# Patient Record
Sex: Male | Born: 1965 | Race: White | Hispanic: No | Marital: Married | Smoking: Former smoker
Health system: Southern US, Community
[De-identification: ages and names within clinical notes are randomized; demographics above are authoritative.]

## PROBLEM LIST (undated history)

## (undated) DIAGNOSIS — E349 Endocrine disorder, unspecified: Secondary | ICD-10-CM

## (undated) DIAGNOSIS — K649 Unspecified hemorrhoids: Secondary | ICD-10-CM

## (undated) DIAGNOSIS — I4510 Unspecified right bundle-branch block: Secondary | ICD-10-CM

## (undated) DIAGNOSIS — Z87442 Personal history of urinary calculi: Secondary | ICD-10-CM

## (undated) DIAGNOSIS — C44519 Basal cell carcinoma of skin of other part of trunk: Secondary | ICD-10-CM

## (undated) DIAGNOSIS — I10 Essential (primary) hypertension: Secondary | ICD-10-CM

## (undated) DIAGNOSIS — M1990 Unspecified osteoarthritis, unspecified site: Secondary | ICD-10-CM

## (undated) DIAGNOSIS — K219 Gastro-esophageal reflux disease without esophagitis: Secondary | ICD-10-CM

## (undated) DIAGNOSIS — D649 Anemia, unspecified: Secondary | ICD-10-CM

## (undated) DIAGNOSIS — D751 Secondary polycythemia: Principal | ICD-10-CM

## (undated) HISTORY — DX: Secondary polycythemia: D75.1

## (undated) HISTORY — PX: TONSILLECTOMY: SUR1361

## (undated) HISTORY — PX: EYE SURGERY: SHX253

## (undated) HISTORY — PX: VASECTOMY: SHX75

## (undated) HISTORY — PX: SKIN CANCER EXCISION: SHX779

## (undated) HISTORY — PX: HAND SURGERY: SHX662

---

## 1999-09-13 ENCOUNTER — Ambulatory Visit (HOSPITAL_BASED_OUTPATIENT_CLINIC_OR_DEPARTMENT_OTHER): Admission: RE | Admit: 1999-09-13 | Discharge: 1999-09-13 | Payer: Self-pay | Admitting: Orthopedic Surgery

## 2006-05-02 ENCOUNTER — Encounter: Payer: Self-pay | Admitting: Vascular Surgery

## 2006-05-02 ENCOUNTER — Ambulatory Visit (HOSPITAL_COMMUNITY): Admission: RE | Admit: 2006-05-02 | Discharge: 2006-05-02 | Payer: Self-pay

## 2007-02-20 ENCOUNTER — Encounter: Admission: RE | Admit: 2007-02-20 | Discharge: 2007-02-20 | Payer: Self-pay | Admitting: Family Medicine

## 2011-12-27 ENCOUNTER — Emergency Department (HOSPITAL_COMMUNITY)
Admission: EM | Admit: 2011-12-27 | Discharge: 2011-12-28 | Disposition: A | Discharge status: Home Health | Payer: 59 | Attending: Emergency Medicine | Admitting: Emergency Medicine

## 2011-12-27 ENCOUNTER — Encounter (HOSPITAL_COMMUNITY): Payer: Self-pay | Admitting: Family Medicine

## 2011-12-27 DIAGNOSIS — K529 Noninfective gastroenteritis and colitis, unspecified: Secondary | ICD-10-CM

## 2011-12-27 DIAGNOSIS — K5289 Other specified noninfective gastroenteritis and colitis: Secondary | ICD-10-CM | POA: Insufficient documentation

## 2011-12-27 DIAGNOSIS — R1032 Left lower quadrant pain: Secondary | ICD-10-CM | POA: Insufficient documentation

## 2011-12-27 DIAGNOSIS — M549 Dorsalgia, unspecified: Secondary | ICD-10-CM | POA: Insufficient documentation

## 2011-12-27 DIAGNOSIS — Z79899 Other long term (current) drug therapy: Secondary | ICD-10-CM | POA: Insufficient documentation

## 2011-12-27 DIAGNOSIS — R109 Unspecified abdominal pain: Secondary | ICD-10-CM | POA: Insufficient documentation

## 2011-12-27 HISTORY — DX: Endocrine disorder, unspecified: E34.9

## 2011-12-27 HISTORY — DX: Gastro-esophageal reflux disease without esophagitis: K21.9

## 2011-12-27 LAB — COMPREHENSIVE METABOLIC PANEL
ALT: 20 U/L (ref 0–53)
AST: 26 U/L (ref 0–37)
Albumin: 4.2 g/dL (ref 3.5–5.2)
CO2: 23 mEq/L (ref 19–32)
Chloride: 99 mEq/L (ref 96–112)
GFR calc non Af Amer: >90 mL/min (ref >90)
Potassium: 3.9 mEq/L (ref 3.5–5.1)
Sodium: 135 mEq/L (ref 135–145)
Total Bilirubin: 0.7 mg/dL (ref 0.3–1.2)

## 2011-12-27 LAB — CBC
MCH: 31 pg (ref 26.0–34.0)
MCHC: 36.4 g/dL — ABNORMAL HIGH (ref 30.0–36.0)
MCV: 85 fL (ref 78.0–100.0)
Platelets: 95 10*3/uL — ABNORMAL LOW (ref 150–400)

## 2011-12-27 LAB — DIFFERENTIAL
Basophils Relative: 0 % (ref 0–1)
Eosinophils Absolute: 0.1 10*3/uL (ref 0.0–0.7)
Lymphs Abs: 1.2 10*3/uL (ref 0.7–4.0)
Monocytes Absolute: 0.9 10*3/uL (ref 0.1–1.0)
Neutrophils Relative %: 63 % (ref 43–77)

## 2011-12-27 LAB — URINALYSIS, ROUTINE W REFLEX MICROSCOPIC
Glucose, UA: NEGATIVE mg/dL
Leukocytes, UA: NEGATIVE
Specific Gravity, Urine: 1.009 (ref 1.005–1.030)
pH: 6.5 (ref 5.0–8.0)

## 2011-12-27 MED ORDER — MORPHINE SULFATE 4 MG/ML IJ SOLN
4.0000 mg | Freq: Once | INTRAMUSCULAR | Status: AC
  Administered 2011-12-27: 4 mg via INTRAVENOUS
  Filled 2011-12-27: qty 1

## 2011-12-27 MED ORDER — ONDANSETRON HCL 4 MG/2ML IJ SOLN
4.0000 mg | Freq: Once | INTRAMUSCULAR | Status: AC
  Administered 2011-12-27: 4 mg via INTRAVENOUS
  Filled 2011-12-27: qty 2

## 2011-12-27 NOTE — ED Notes (Signed)
Pt states he has seen md for back pain has had low grade fever since yesterday. Pt also states he was given pain meds per md but did not take them. Pt states he wants to know what is causing

## 2011-12-27 NOTE — ED Notes (Signed)
Pt went to [facility] physician of [facility]. States the doctor there thought he had diverticulitis and rx him abx and pain meds. States pain has increased on left side. Denies any urinary problems.

## 2011-12-28 ENCOUNTER — Emergency Department (HOSPITAL_COMMUNITY): Payer: 59

## 2011-12-28 MED ORDER — HYDROCODONE-ACETAMINOPHEN 5-325 MG PO TABS
1.0000 | ORAL_TABLET | Freq: Once | ORAL | Status: AC
  Administered 2011-12-28: 1 via ORAL
  Filled 2011-12-28: qty 1

## 2011-12-28 NOTE — ED Provider Notes (Signed)
History     CSN: [id number]  Arrival date & time 12/27/11  1800   First MD Initiated Contact with Patient 12/27/11 2337      Chief Complaint  Patient presents with  . Back Pain    (Consider location/radiation/quality/duration/timing/severity/associated sxs/prior treatment) HPI Comments: For the last 1 week patient has had intermittent left flank pain that radiates into the left side of the abdomen. The pain is waxing and waning in severity. It is very sharp when it is severe but dull the rest of the time. Is not associated with urination but maybe a little worse after eating. He shouldn't saw his doctor yesterday and was placed on antibiotics due to low-grade fever and persistent pain. There was concern for possible diverticulitis. In the office patient had a normal urine. However because the pain worsened today he decided to come for further evaluation.  Patient is a 46 y.o. male presenting with flank pain. The history is provided by the patient.  Flank Pain This is a new problem. Episode onset:  One week ago. The problem occurs constantly. The problem has been gradually worsening ( pain is waxing and waning in severity. Was severe from 3 PM to 8 PM tonight.). Associated symptoms include abdominal pain. Pertinent negatives include no chest pain and no shortness of breath. Exacerbated by:  maybe a little worse after eating. The symptoms are relieved by nothing. Treatments tried:  started on Cipro and Flagyl yesterday by his PCP for possible diverticulitis. The treatment provided no relief.    Past Medical History  Diagnosis Date  . Testosterone deficiency   . Acid reflux     Past Surgical History  Procedure Date  . Vasectomy   . Tonsillectomy     History reviewed. No pertinent family history.  History  Substance Use Topics  . Smoking status: Former Smoker    Quit date: 12/27/1994  . Smokeless tobacco: Not on file  . Alcohol Use: Yes     occasionally      Review of  Systems  Respiratory: Negative for shortness of breath.   Cardiovascular: Negative for chest pain.  Gastrointestinal: Positive for abdominal pain.  Genitourinary: Positive for flank pain.  All other systems reviewed and are negative.    Allergies  Review of patient's allergies indicates no known allergies.  Home Medications   Current Outpatient Rx  Name Route Sig Dispense Refill  . CIPROFLOXACIN HCL 250 MG PO TABS Oral Take 250 mg by mouth 2 (two) times daily.    Marland Kitchen GLUCOSAMINE-CHONDROITIN 500-400 MG PO TABS Oral Take 1 tablet by mouth daily.    Marland Kitchen METRONIDAZOLE 250 MG PO TABS Oral Take 250 mg by mouth 3 (three) times daily.    Marland Kitchen PANTOPRAZOLE SODIUM 40 MG PO TBEC Oral Take 40 mg by mouth daily.    . TESTOSTERONE CYPIONATE 100 MG/ML IM OIL Intramuscular Inject 200 mg into the muscle every 14 (fourteen) days. For IM use only due 12/27/2011      BP 147/95  Pulse 96  Temp(Src) 98 F (36.7 C) (Oral)  Resp 20  Wt 198 lb (89.812 kg)  SpO2 98%  Physical Exam  Nursing note and vitals reviewed. Constitutional: He is oriented to person, place, and time. He appears well-developed and well-nourished. No distress.  HENT:  Head: Normocephalic and atraumatic.  Mouth/Throat: Oropharynx is clear and moist.  Eyes: Conjunctivae and EOM are normal. Pupils are equal, round, and reactive to light.  Neck: Normal range of motion. Neck supple.  Cardiovascular:  Normal rate, regular rhythm and intact distal pulses.   No murmur heard. Pulmonary/Chest: Effort normal and breath sounds normal. No respiratory distress. He has no wheezes. He has no rales.  Abdominal: Soft. Normal appearance. He exhibits no distension. There is no tenderness. There is CVA tenderness. There is no rebound and no guarding.       Mild left flank pain and mild left-sided abdominal pain. No reproducible left lower quadrant pain  Musculoskeletal: Normal range of motion. He exhibits no edema and no tenderness.  Neurological: He is  alert and oriented to person, place, and time.  Skin: Skin is warm and dry. No rash noted. No erythema.  Psychiatric: He has a normal mood and affect. His behavior is normal.    ED Course  Procedures (including critical care time)  Labs Reviewed  URINALYSIS, ROUTINE W REFLEX MICROSCOPIC - Abnormal; Notable for the following:    Ketones, ur TRACE (*)    All other components within normal limits  CBC - Abnormal; Notable for the following:    Hemoglobin 17.8 (*)    MCHC 36.4 (*)    Platelets 95 (*) REPEATED TO VERIFY   All other components within normal limits  DIFFERENTIAL - Abnormal; Notable for the following:    Monocytes Relative 16 (*)    All other components within normal limits  COMPREHENSIVE METABOLIC PANEL   Ct Abdomen Pelvis Wo Contrast  12/28/2011  *RADIOLOGY REPORT*  Clinical Data: Left flank pain radiating to the left lower quadrant.  Fever.  History of calculi.  CT ABDOMEN AND PELVIS WITHOUT CONTRAST  Technique:  Multidetector CT imaging of the abdomen and pelvis was performed following the standard protocol without intravenous contrast.  Comparison: 02/20/2007  Findings: The visualized portion of the liver, spleen, pancreas, and adrenal glands appear unremarkable in noncontrast CT appearance.  The gallbladder and biliary system appear unremarkable.  The appendix appears normal.  Scattered air-fluid levels are present in nondilated proximal loops of small bowel, conceivably representing low-level enteritis, but without overt bowel wall thickening.  The kidneys appear unremarkable, as do the proximal ureters.  No ureteral calculi noted.  Mild atherosclerotic calcification of the abdominal aorta is present.  Small retroperitoneal lymph nodes are not pathologically enlarged by size criteria.  Scattered small pericecal lymph nodes are also noted.  Urinary bladder appears normal.  No free fluid in the abdomen or pelvis is observed.  No findings of sigmoid diverticulosis or diverticulitis.   There is prominent loss of intervertebral disc space along with vacuum disc phenomenon at L5-S1.  Posterior osseous ridging is present along with 2-3 mm posterior subluxation of L5 on S1.  There may be mild left foraminal stenosis and potentially a right subarticular lateral recess stenosis at this level.  IMPRESSION:  1.  Lumbar spondylosis at L5 S1, potentially causing right subarticular lateral recess stenosis and slight left foraminal stenosis. 2.  Mild atherosclerosis of the abdominal aorta. 3.  Scattered air-fluid levels in nondilated proximal loops of small bowel, query minimal proximal enteritis.  Original Report Authenticated By: Dellia Cloud, M.D.     1. Enteritis       MDM   Patient with atypical abdominal pain which seems to be in the left flank area and radiate around to the side of the abdomen. This has been ongoing for about one week. He did have a fever 2 days ago but none today. Patient was seen by his PCP and started on antibiotics 24 hours ago for possible diverticulitis. Urine  at the office was within normal limits. Patient's symptoms are concerning for a possible kidney stone versus GI pathology. However UA here is negative for blood or infection. CBC, CMP is within normal limits other than low platelet count of 95. When discussed with the patient his wife states he has had this before and it is not new.  Patient is dehydrated with ketones in his urine and he hemoconcentrated with a hemoglobin of 17 on CBC.  Discuss with radiology and 2 to the concern for a possible stone he recommended doing a noncontrasted CT for evaluation. CT showed scattered air-fluid levels within nondilated proximal loops of small bowel which may be be due to an enteritis. There is no signs of AAA and symptoms are not suggestive of dissection. Will have patient continue his oral antibiotics and followup with his doctor on Monday.        Gwyneth Sprout, MD 12/29/11 0236

## 2012-07-10 ENCOUNTER — Other Ambulatory Visit: Payer: Self-pay | Admitting: Cardiology

## 2012-07-10 ENCOUNTER — Ambulatory Visit
Admission: RE | Admit: 2012-07-10 | Discharge: 2012-07-10 | Disposition: A | Discharge status: Home / Self Care | Payer: 59 | Source: Ambulatory Visit | Attending: Cardiology | Admitting: Cardiology

## 2012-07-10 DIAGNOSIS — R0602 Shortness of breath: Secondary | ICD-10-CM

## 2015-05-22 ENCOUNTER — Encounter: Payer: Self-pay | Admitting: Oncology

## 2015-06-26 ENCOUNTER — Encounter: Payer: Self-pay | Admitting: Oncology

## 2015-06-26 ENCOUNTER — Ambulatory Visit (INDEPENDENT_AMBULATORY_CARE_PROVIDER_SITE_OTHER): Payer: 59 | Admitting: Oncology

## 2015-06-26 VITALS — BP 153/83 | HR 76 | Temp 98.2°F | Ht 68.0 in | Wt 199.4 lb

## 2015-06-26 DIAGNOSIS — D696 Thrombocytopenia, unspecified: Secondary | ICD-10-CM | POA: Diagnosis not present

## 2015-06-26 DIAGNOSIS — D751 Secondary polycythemia: Secondary | ICD-10-CM | POA: Diagnosis not present

## 2015-06-26 HISTORY — DX: Secondary polycythemia: D75.1

## 2015-06-26 LAB — SAVE SMEAR

## 2015-06-26 NOTE — Progress Notes (Signed)
Patient ID: Jerry Roman, male   DOB: Jun 19, 1966, 49 y.o.   MRN: [id number] New Patient Hematology   Jerry Roman [id number] 1966-04-18 49 y.o. 06/26/2015  CC: Dr. Ronald Lobo, Dr. Christella Noa, Dr. Carolan Clines   Reason for referral: Evaluate chronic mild thrombocytopenia and chronic, erythrocytosis, and a nonsmoker.   HPI:  Pleasant 49 year old man who has been in overall excellent health. His wife who has worked for Dr. Cristina Gong for many years has detailed records of his laboratory studies. We have lab back to July 2006! At that time hemoglobin 17.8, hematocrit 50, MCV 88.6, and platelet count 120,000. Over the years, and platelet counts have fluctuated with values as high as 161,000 recorded 11/13/2002 to lowest value recorded of 100,000 on 02/22/2015. He has never had any clinical bleeding or easy bruising. No known history of hepatitis, yellow jaundice, or mononucleosis. No known family history of any platelet disorder or other blood disorder. In a similar vein, hemoglobin and hematocrit has fluctuated with hemoglobin in range 16-18. Most recent value on 02/22/2015 16.7 with hematocrit 48, MCV 88. White count has been normal most recent 5600 with 53% neutrophils 35 lymphocytes 11 monocytes. For long time he was donating blood 1-3 times per year. Ferritin level fell down to 5.2 on 07/01/2014. Concomitant hemoglobin was 15.8 with hematocrit 48.1. He was put on an oral iron supplement. He stopped smoking 20 years ago. He is not on any diuretics. He has no chronic lung disease. He has been taking testosterone injections every 2 weeks for the last 6 years. A colonoscopy done one year ago for low-grade rectal bleeding and low iron negative except for hemorrhoids.  A 39 year old brother had a melanoma skin cancer excised but did not require any additional treatment.  PMH: Past Medical History  Diagnosis Date  . Testosterone deficiency   . Acid reflux   . 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 Vasectomy    . Tonsillectomy      Allergies: No Known Allergies  Medications:  Current outpatient prescriptions:  .  cholecalciferol (VITAMIN D) 1000 UNITS tablet, Take 1,000 Units by mouth daily., Disp: , Rfl:  .  tadalafil (CIALIS) 10 MG tablet, Take 10 mg by mouth daily as needed for erectile dysfunction., Disp: , Rfl:  .  pantoprazole (PROTONIX) 40 MG tablet, Take 40 mg by mouth daily., Disp: , Rfl:  .  testosterone cypionate (DEPOTESTOTERONE CYPIONATE) 100 MG/ML injection, Inject 200 mg into the muscle every 14 (fourteen) days. 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 No exposure to dyes. Moderate, 20 girls from his first wife and 1 daughter from his current wife of 74 years.  he quit smoking about 20 years ago. .  he drinks alcohol.  he does not use illicit drugs.  Family History: See history of present illness. Brother 83 with early stage melanoma resected and currently doing well. Brother aged 20 who is healthy.  Review of Systems: See HPI. Otherwise negative.  Physical Exam: Blood pressure 153/83, pulse 76, temperature 98.2 F (36.8 C), temperature source Oral, height 5\' 8"  (1.727 m), weight 199 lb 6.4 oz (90.447 kg), SpO2 99 %. Wt Readings from Last 3 Encounters:  06/26/15 199 lb 6.4 oz (90.447 kg)  12/27/11 198 lb (89.812 kg)     General appearance: Well-nourished  Caucasian man. Mild facial flushing. HENNT: Pharynx no erythema, exudate, mass, or ulcer. No thyromegaly or thyroid nodules Lymph nodes: No cervical, supraclavicular, or axillary lymphadenopathy Breasts:  Lungs: Clear to auscultation, resonant to percussion throughout Heart: Regular rhythm, no murmur, no gallop, no rub, no click, no edema Abdomen: Soft, nontender, normal bowel sounds, no mass,  no organomegaly Extremities: No edema, no calf tenderness Musculoskeletal: no joint deformities GU: Vascular: Carotid pulses 2+, no bruits,  Neurologic: Alert, oriented, PERRLA, optic discs sharp moderate retinal vein distention. no hemorrhage or exudate, cranial nerves grossly normal, motor strength 5 over 5, reflexes 1+ symmetric, upper body coordination normal, gait normal, Skin: No rash or ecchymosis    Lab Results: Lab Results  Component Value Date   WBC 5.9 12/27/2011   HGB 17.8* 12/27/2011   HCT 48.9 12/27/2011   MCV 85.0 12/27/2011   PLT 95* 12/27/2011     Chemistry      Component Value Date/Time   NA 135 12/27/2011 2245   K 3.9 12/27/2011 2245   CL 99 12/27/2011 2245   CO2 23 12/27/2011 2245   BUN 14 12/27/2011 2245   CREATININE 0.89 12/27/2011 2245      Component Value Date/Time   CALCIUM 9.4 12/27/2011 2245   ALKPHOS 67 12/27/2011 2245   AST 26 12/27/2011 2245   ALT 20 12/27/2011 2245   BILITOT 0.7 12/27/2011 2245        Impression: #1. Polycythemia He may be on the far end of the Bell curve for high normal hemoglobins since hemoglobins have not progressively risen over a 12 year interval. On the other hand, this could be polycythemia vera which has been partially treated by his intermittent blood donations. Other things to consider in the differential would include a high oxygen affinity hemoglobin or a familial polycythemia. Recommendation: He did agree to have genetic testing for the JAK-2 gene which is highly specific for polycythemia vera. We discussed the high cost of the test ($454).  Current recommendations are to keep hematocrit is close to normal as possible and to add aspirin for thromboprophylaxis. If his gene study is positive for the mutation, I will start him on aspirin and let him go back to donating blood. Getting his ferritin level is actually a goal of treatment since this puts a break on blood production and allows one to decrease the  interval between phlebotomies. His regular testosterone use may promote a higher hemoglobin and hematocrit and be an additional reason to put him on a limited phlebotomy program.  #2. Mild, chronic, fluctuating thrombocytopenia This may be a footprint of a previous viral infection. Whatever the cause, it has not been progressive. He has no bleeding problems. I told him he could have major surgery with a platelet count of 100,000 or above. No further evaluation and planned for this.    Annia Belt, MD 06/26/2015, 5:40 PM

## 2015-06-26 NOTE — Patient Instructions (Signed)
To lab today Return visit as needed 

## 2015-06-27 ENCOUNTER — Ambulatory Visit: Payer: 59

## 2015-06-27 LAB — CBC WITH DIFFERENTIAL/PLATELET
BASOS ABS: 0 10*3/uL (ref 0.0–0.2)
Basos: 0 %
EOS (ABSOLUTE): 0.1 10*3/uL (ref 0.0–0.4)
Eos: 1 %
HEMOGLOBIN: 17.6 g/dL (ref 12.6–17.7)
Hematocrit: 49.9 % (ref 37.5–51.0)
Immature Grans (Abs): 0 10*3/uL (ref 0.0–0.1)
Immature Granulocytes: 0 %
LYMPHS ABS: 1.5 10*3/uL (ref 0.7–3.1)
Lymphs: 28 %
MCH: 30.7 pg (ref 26.6–33.0)
MCHC: 35.3 g/dL (ref 31.5–35.7)
MCV: 87 fL (ref 79–97)
MONOCYTES: 12 %
Monocytes Absolute: 0.6 10*3/uL (ref 0.1–0.9)
NEUTROS ABS: 3.1 10*3/uL (ref 1.4–7.0)
Neutrophils: 59 %
Platelets: 107 10*3/uL — ABNORMAL LOW (ref 150–379)
RBC: 5.73 x10E6/uL (ref 4.14–5.80)
RDW: 13.7 % (ref 12.3–15.4)
WBC: 5.2 10*3/uL (ref 3.4–10.8)

## 2015-06-27 LAB — SEDIMENTATION RATE: SED RATE: 2 mm/h (ref 0–30)

## 2015-07-10 ENCOUNTER — Telehealth: Payer: Self-pay | Admitting: Oncology

## 2015-07-10 NOTE — Telephone Encounter (Signed)
Call pt: genetic test for inherited polycythemia just reported on 10/14 and it is negative

## 2015-07-10 NOTE — Telephone Encounter (Addendum)
Patient's wife Jerry Roman called in reference to patient lab test drawn on 06/26/2015.  She would like to know when they will hear back about the results.  She can be reached @ [phone].

## 2015-07-10 NOTE — Telephone Encounter (Signed)
Called pt - no answer; left message Dr Beryle Beams is on vacation but checks messages when able. And as soon as I heard from him, I will call the pt. And if they have any questions to call back.

## 2015-07-10 NOTE — Telephone Encounter (Signed)
Talked to pt's wife Freda Munro - informed her, pt's "genetic test for inherited polycythemia just reported on 10/4 and it is negative" per Dr Beryle Beams.. Voiced understanding but also wants to know Platelet count - said u were going to look at it under the microscope; willing to wait until u get back next week. Thanks

## 2015-08-29 ENCOUNTER — Telehealth: Payer: Self-pay | Admitting: *Deleted

## 2015-08-29 NOTE — Telephone Encounter (Signed)
Call from pt 's wife - stated at last office visit, you were going to look at his cells under the microscope yourself, ?smear and let them know the result. They only heard back from genetic testing. Wife's direct # is [phone]. Made awared Dr Beryle Beams out of the office until Thursday. Thanks

## 2015-09-04 NOTE — Telephone Encounter (Signed)
Talked to Dr Beryle Beams - he will review pt's labs/chart; called pt's wife, no answer, left message [phone]).

## 2015-09-06 NOTE — Telephone Encounter (Signed)
I spoke with patient 12/13. Reported to him that review of peripheral blood was unremarkable. He was more concerned with the bill he got for the visit. I referred him to our office manager.

## 2016-02-13 ENCOUNTER — Ambulatory Visit (INDEPENDENT_AMBULATORY_CARE_PROVIDER_SITE_OTHER): Payer: 59 | Admitting: Podiatry

## 2016-02-13 ENCOUNTER — Encounter: Payer: Self-pay | Admitting: Podiatry

## 2016-02-13 ENCOUNTER — Ambulatory Visit (INDEPENDENT_AMBULATORY_CARE_PROVIDER_SITE_OTHER): Payer: 59

## 2016-02-13 VITALS — BP 158/92 | HR 69 | Resp 12

## 2016-02-13 DIAGNOSIS — M7662 Achilles tendinitis, left leg: Secondary | ICD-10-CM | POA: Diagnosis not present

## 2016-02-13 MED ORDER — MELOXICAM 15 MG PO TABS: 15.0000 mg | ORAL_TABLET | Freq: Every day | ORAL | Status: DC

## 2016-02-13 MED ORDER — METHYLPREDNISOLONE 4 MG PO TBPK: ORAL_TABLET | ORAL | Status: DC

## 2016-02-13 NOTE — Patient Instructions (Signed)
Plantar Fasciitis Plantar fasciitis is a painful foot condition that affects the heel. It occurs when the band of tissue that connects the toes to the heel bone (plantar fascia) becomes irritated. This can happen after exercising too much or doing other repetitive activities (overuse injury). The pain from plantar fasciitis can range from mild irritation to severe pain that makes it difficult for you to walk or move. The pain is usually worse in the morning or after you have been sitting or lying down for a while. CAUSES This condition may be caused by:  Standing for long periods of time.  Wearing shoes that do not fit.  Doing high-impact activities, including running, aerobics, and ballet.  Being overweight.  Having an abnormal way of walking (gait).  Having tight calf muscles.  Having high arches in your feet.  Starting a new athletic activity. SYMPTOMS The main symptom of this condition is heel pain. Other symptoms include:  Pain that gets worse after activity or exercise.  Pain that is worse in the morning or after resting.  Pain that goes away after you walk for a few minutes. DIAGNOSIS This condition may be diagnosed based on your signs and symptoms. Your health care provider will also do a physical exam to check for:  A tender area on the bottom of your foot.  A high arch in your foot.  Pain when you move your foot.  Difficulty moving your foot. You may also need to have imaging studies to confirm the diagnosis. These can include:  X-rays.  Ultrasound.  MRI. TREATMENT  Treatment for plantar fasciitis depends on the severity of the condition. Your treatment may include:  Rest, ice, and over-the-counter pain medicines to manage your pain.  Exercises to stretch your calves and your plantar fascia.  A splint that holds your foot in a stretched, upward position while you sleep (night splint).  Physical therapy to relieve symptoms and prevent problems in the  future.  Cortisone injections to relieve severe pain.  Extracorporeal shock wave therapy (ESWT) to stimulate damaged plantar fascia with electrical impulses. It is often used as a last resort before surgery.  Surgery, if other treatments have not worked after 12 months. HOME CARE INSTRUCTIONS  Take medicines only as directed by your health care provider.  Avoid activities that cause pain.  Roll the bottom of your foot over a bag of ice or a bottle of cold water. Do this for 20 minutes, 3-4 times a day.  Perform simple stretches as directed by your health care provider.  Try wearing athletic shoes with air-sole or gel-sole cushions or soft shoe inserts.  Wear a night splint while sleeping, if directed by your health care provider.  Keep all follow-up appointments with your health care provider. PREVENTION   Do not perform exercises or activities that cause heel pain.  Consider finding low-impact activities if you continue to have problems.  Lose weight if you need to. The best way to prevent plantar fasciitis is to avoid the activities that aggravate your plantar fascia. SEEK MEDICAL CARE IF:  Your symptoms do not go away after treatment with home care measures.  Your pain gets worse.  Your pain affects your ability to move or do your daily activities.   This information is not intended to replace advice given to you by your health care provider. Make sure you discuss any questions you have with your health care provider.   Document Released: 06/04/2001 Document Revised: 05/31/2015 Document Reviewed: 07/20/2014 Elsevier   Interactive Patient Education 2016 [employer]. Achilles Tendinitis With Rehab Achilles tendinitis is a disorder of the Achilles tendon. The Achilles tendon connects the large calf muscles (Gastrocnemius and Soleus) to the heel bone (calcaneus). This tendon is sometimes called the heel cord. It is important for pushing-off and standing on your toes and is  important for walking, running, or jumping. Tendinitis is often caused by overuse and repetitive microtrauma. SYMPTOMS  Pain, tenderness, swelling, warmth, and redness may occur over the Achilles tendon even at rest.  Pain with pushing off, or flexing or extending the ankle.  Pain that is worsened after or during activity. CAUSES   Overuse sometimes seen with rapid increase in exercise programs or in sports requiring running and jumping.  Poor physical conditioning (strength and flexibility or endurance).  Running sports, especially training running down hills.  Inadequate warm-up before practice or play or failure to stretch before participation.  Injury to the tendon. PREVENTION   Warm up and stretch before practice or competition.  Allow time for adequate rest and recovery between practices and competition.  Keep up conditioning.  Keep up ankle and leg flexibility.  Improve or keep muscle strength and endurance.  Improve cardiovascular fitness.  Use proper technique.  Use proper equipment (shoes, skates).  To help prevent recurrence, taping, protective strapping, or an adhesive bandage may be recommended for several weeks after healing is complete. PROGNOSIS   Recovery may take weeks to several months to heal.  Longer recovery is expected if symptoms have been prolonged.  Recovery is usually quicker if the inflammation is due to a direct blow as compared with overuse or sudden strain. RELATED COMPLICATIONS   Healing time will be prolonged if the condition is not correctly treated. The injury must be given plenty of time to heal.  Symptoms can reoccur if activity is resumed too soon.  Untreated, tendinitis may increase the risk of tendon rupture requiring additional time for recovery and possibly surgery. TREATMENT   The first treatment consists of rest anti-inflammatory medication, and ice to relieve the pain.  Stretching and strengthening exercises after  resolution of pain will likely help reduce the risk of recurrence. Referral to a physical therapist or athletic trainer for further evaluation and treatment may be helpful.  A walking boot or cast may be recommended to rest the Achilles tendon. This can help break the cycle of inflammation and microtrauma.  Arch supports (orthotics) may be prescribed or recommended by your caregiver as an adjunct to therapy and rest.  Surgery to remove the inflamed tendon lining or degenerated tendon tissue is rarely necessary and has shown less than predictable results. MEDICATION   Nonsteroidal anti-inflammatory medications, such as aspirin and ibuprofen, may be used for pain and inflammation relief. Do not take within 7 days before surgery. Take these as directed by your caregiver. Contact your caregiver immediately if any bleeding, stomach upset, or signs of allergic reaction occur. Other minor pain relievers, such as acetaminophen, may also be used.  Pain relievers may be prescribed as necessary by your caregiver. Do not take prescription pain medication for longer than 4 to 7 days. Use only as directed and only as much as you need.  Cortisone injections are rarely indicated. Cortisone injections may weaken tendons and predispose to rupture. It is better to give the condition more time to heal than to use them. HEAT AND COLD  Cold is used to relieve pain and reduce inflammation for acute and chronic Achilles tendinitis. Cold should be  applied for 10 to 15 minutes every 2 to 3 hours for inflammation and pain and immediately after any activity that aggravates your symptoms. Use ice packs or an ice massage.  Heat may be used before performing stretching and strengthening activities prescribed by your caregiver. Use a heat pack or a warm soak. SEEK MEDICAL CARE IF:  Symptoms get worse or do not improve in 2 weeks despite treatment.  New, unexplained symptoms develop. Drugs used in treatment may produce side  effects. EXERCISES RANGE OF MOTION (ROM) AND STRETCHING EXERCISES - Achilles Tendinitis  These exercises may help you when beginning to rehabilitate your injury. Your symptoms may resolve with or without further involvement from your physician, physical therapist or athletic trainer. While completing these exercises, remember:   Restoring tissue flexibility helps normal motion to return to the joints. This allows healthier, less painful movement and activity.  An effective stretch should be held for at least 30 seconds.  A stretch should never be painful. You should only feel a gentle lengthening or release in the stretched tissue. STRETCH - Gastroc, Standing   Place hands on wall.  Extend right / left leg, keeping the front knee somewhat bent.  Slightly point your toes inward on your back foot.  Keeping your right / left heel on the floor and your knee straight, shift your weight toward the wall, not allowing your back to arch.  You should feel a gentle stretch in the right / left calf. Hold this position for __________ seconds. Repeat __________ times. Complete this stretch __________ times per day. STRETCH - Soleus, Standing   Place hands on wall.  Extend right / left leg, keeping the other knee somewhat bent.  Slightly point your toes inward on your back foot.  Keep your right / left heel on the floor, bend your back knee, and slightly shift your weight over the back leg so that you feel a gentle stretch deep in your back calf.  Hold this position for __________ seconds. Repeat __________ times. Complete this stretch __________ times per day. STRETCH - Gastrocsoleus, Standing  Note: This exercise can place a lot of stress on your foot and ankle. Please complete this exercise only if specifically instructed by your caregiver.   Place the ball of your right / left foot on a step, keeping your other foot firmly on the same step.  Hold on to the wall or a rail for  balance.  Slowly lift your other foot, allowing your body weight to press your heel down over the edge of the step.  You should feel a stretch in your right / left calf.  Hold this position for __________ seconds.  Repeat this exercise with a slight bend in your knee. Repeat __________ times. Complete this stretch __________ times per day.  STRENGTHENING EXERCISES - Achilles Tendinitis These exercises may help you when beginning to rehabilitate your injury. They may resolve your symptoms with or without further involvement from your physician, physical therapist or athletic trainer. While completing these exercises, remember:   Muscles can gain both the endurance and the strength needed for everyday activities through controlled exercises.  Complete these exercises as instructed by your physician, physical therapist or athletic trainer. Progress the resistance and repetitions only as guided.  You may experience muscle soreness or fatigue, but the pain or discomfort you are trying to eliminate should never worsen during these exercises. If this pain does worsen, stop and make certain you are following the directions exactly. If  the pain is still present after adjustments, discontinue the exercise until you can discuss the trouble with your clinician. STRENGTH - Plantar-flexors   Sit with your right / left leg extended. Holding onto both ends of a rubber exercise band/tubing, loop it around the ball of your foot. Keep a slight tension in the band.  Slowly push your toes away from you, pointing them downward.  Hold this position for __________ seconds. Return slowly, controlling the tension in the band/tubing. Repeat __________ times. Complete this exercise __________ times per day.  STRENGTH - Plantar-flexors   Stand with your feet shoulder width apart. Steady yourself with a wall or table using as little support as needed.  Keeping your weight evenly spread over the width of your feet,  rise up on your toes.*  Hold this position for __________ seconds. Repeat __________ times. Complete this exercise __________ times per day.  *If this is too easy, shift your weight toward your right / left leg until you feel challenged. Ultimately, you may be asked to do this exercise with your right / left foot only. STRENGTH - Plantar-flexors, Eccentric  Note: This exercise can place a lot of stress on your foot and ankle. Please complete this exercise only if specifically instructed by your caregiver.   Place the balls of your feet on a step. With your hands, use only enough support from a wall or rail to keep your balance.  Keep your knees straight and rise up on your toes.  Slowly shift your weight entirely to your right / left toes and pick up your opposite foot. Gently and with controlled movement, lower your weight through your right / left foot so that your heel drops below the level of the step. You will feel a slight stretch in the back of your calf at the end position.  Use the healthy leg to help rise up onto the balls of both feet, then lower weight only on the right / left leg again. Build up to 15 repetitions. Then progress to 3 consecutive sets of 15 repetitions.*  After completing the above exercise, complete the same exercise with a slight knee bend (about 30 degrees). Again, build up to 15 repetitions. Then progress to 3 consecutive sets of 15 repetitions.* Perform this exercise __________ times per day.  *When you easily complete 3 sets of 15, your physician, physical therapist or athletic trainer may advise you to add resistance by wearing a backpack filled with additional weight. STRENGTH - Plantar Flexors, Seated   Sit on a chair that allows your feet to rest flat on the ground. If necessary, sit at the edge of the chair.  Keeping your toes firmly on the ground, lift your right / left heel as far as you can without increasing any discomfort in your ankle. Repeat  __________ times. Complete this exercise __________ times a day. *If instructed by your physician, physical therapist or athletic trainer, you may add ____________________ of resistance by placing a weighted object on your right / left knee.   This information is not intended to replace advice given to you by your health care provider. Make sure you discuss any questions you have with your health care provider.   Document Released: 04/10/2005 Document Revised: 09/30/2014 Document Reviewed: 12/22/2008 Elsevier Interactive Patient Education [employer].

## 2016-02-13 NOTE — Progress Notes (Signed)
   Subjective:    Patient ID: Jerry Roman, male    DOB: 1965-12-08, 50 y.o.   MRN: [id number]  HPI: He presents today with a two-month history of pain to the Achilles left. States that he has a history of plantar fasciitis on and off for several years but now seems to be the left heel he states that it seems to be getting better over the last few weeks. Relates that the heel plantar and posterior are stiff in the mornings when he gets up or after sitting for a while.    Review of Systems  Musculoskeletal: Positive for gait problem.       Objective:   Physical Exam: Vital signs are stable mildly elevated blood pressure today. Neurologic sensorium is intact pulses are strongly palpable. Capillary fill time is immediate deep tendon reflexes are intact bilateral symmetrical bilateral. Muscle strength normal bilateral. Orthopedic evaluation demonstrates all joints distal to the ankle for range of motion and crepitation. He has no pain on palpation medial calcaneal tubercle or the Achilles posteriorly. Cutaneous evaluation demonstrates supple well-hydrated cutis no lesions or wounds. Radiographs taken today demonstrate no major osseous findings.        Assessment & Plan:  Assessment: Achilles tendinitis left insertional.  Plan: Placed him in a night splint and prescribed Medrol Dosepak to be followed by meloxicam.

## 2016-03-28 ENCOUNTER — Ambulatory Visit: Payer: 59 | Admitting: Podiatry

## 2016-11-18 DIAGNOSIS — D1801 Hemangioma of skin and subcutaneous tissue: Secondary | ICD-10-CM | POA: Diagnosis not present

## 2016-11-18 DIAGNOSIS — L814 Other melanin hyperpigmentation: Secondary | ICD-10-CM | POA: Diagnosis not present

## 2016-11-18 DIAGNOSIS — L821 Other seborrheic keratosis: Secondary | ICD-10-CM | POA: Diagnosis not present

## 2016-12-01 DIAGNOSIS — J019 Acute sinusitis, unspecified: Secondary | ICD-10-CM | POA: Diagnosis not present

## 2017-01-03 DIAGNOSIS — Z13220 Encounter for screening for lipoid disorders: Secondary | ICD-10-CM | POA: Diagnosis not present

## 2017-01-03 DIAGNOSIS — R7989 Other specified abnormal findings of blood chemistry: Secondary | ICD-10-CM | POA: Diagnosis not present

## 2017-01-03 DIAGNOSIS — Z0000 Encounter for general adult medical examination without abnormal findings: Secondary | ICD-10-CM | POA: Diagnosis not present

## 2017-05-02 DIAGNOSIS — D751 Secondary polycythemia: Secondary | ICD-10-CM | POA: Diagnosis not present

## 2017-05-02 DIAGNOSIS — Z23 Encounter for immunization: Secondary | ICD-10-CM | POA: Diagnosis not present

## 2017-05-02 DIAGNOSIS — E7800 Pure hypercholesterolemia, unspecified: Secondary | ICD-10-CM | POA: Diagnosis not present

## 2017-05-02 DIAGNOSIS — R030 Elevated blood-pressure reading, without diagnosis of hypertension: Secondary | ICD-10-CM | POA: Diagnosis not present

## 2017-06-20 DIAGNOSIS — E291 Testicular hypofunction: Secondary | ICD-10-CM | POA: Diagnosis not present

## 2017-07-04 DIAGNOSIS — E291 Testicular hypofunction: Secondary | ICD-10-CM | POA: Diagnosis not present

## 2017-11-19 DIAGNOSIS — L821 Other seborrheic keratosis: Secondary | ICD-10-CM | POA: Diagnosis not present

## 2017-11-19 DIAGNOSIS — L814 Other melanin hyperpigmentation: Secondary | ICD-10-CM | POA: Diagnosis not present

## 2017-11-19 DIAGNOSIS — D1801 Hemangioma of skin and subcutaneous tissue: Secondary | ICD-10-CM | POA: Diagnosis not present

## 2017-12-19 DIAGNOSIS — E291 Testicular hypofunction: Secondary | ICD-10-CM | POA: Diagnosis not present

## 2017-12-19 DIAGNOSIS — N401 Enlarged prostate with lower urinary tract symptoms: Secondary | ICD-10-CM | POA: Diagnosis not present

## 2017-12-19 DIAGNOSIS — Z23 Encounter for immunization: Secondary | ICD-10-CM | POA: Diagnosis not present

## 2018-01-02 DIAGNOSIS — R229 Localized swelling, mass and lump, unspecified: Secondary | ICD-10-CM | POA: Diagnosis not present

## 2018-01-02 DIAGNOSIS — E291 Testicular hypofunction: Secondary | ICD-10-CM | POA: Diagnosis not present

## 2018-01-02 DIAGNOSIS — M659 Synovitis and tenosynovitis, unspecified: Secondary | ICD-10-CM | POA: Diagnosis not present

## 2018-01-02 DIAGNOSIS — R52 Pain, unspecified: Secondary | ICD-10-CM | POA: Diagnosis not present

## 2018-02-20 DIAGNOSIS — M65841 Other synovitis and tenosynovitis, right hand: Secondary | ICD-10-CM | POA: Diagnosis not present

## 2018-04-17 DIAGNOSIS — Z0000 Encounter for general adult medical examination without abnormal findings: Secondary | ICD-10-CM | POA: Diagnosis not present

## 2018-04-17 DIAGNOSIS — Z131 Encounter for screening for diabetes mellitus: Secondary | ICD-10-CM | POA: Diagnosis not present

## 2018-04-17 DIAGNOSIS — Z1159 Encounter for screening for other viral diseases: Secondary | ICD-10-CM | POA: Diagnosis not present

## 2018-04-17 DIAGNOSIS — M659 Synovitis and tenosynovitis, unspecified: Secondary | ICD-10-CM | POA: Diagnosis not present

## 2018-04-17 DIAGNOSIS — Z136 Encounter for screening for cardiovascular disorders: Secondary | ICD-10-CM | POA: Diagnosis not present

## 2018-05-29 DIAGNOSIS — Z79899 Other long term (current) drug therapy: Secondary | ICD-10-CM | POA: Diagnosis not present

## 2018-05-29 DIAGNOSIS — K219 Gastro-esophageal reflux disease without esophagitis: Secondary | ICD-10-CM | POA: Diagnosis not present

## 2018-05-29 DIAGNOSIS — R740 Nonspecific elevation of levels of transaminase and lactic acid dehydrogenase [LDH]: Secondary | ICD-10-CM | POA: Diagnosis not present

## 2018-07-03 DIAGNOSIS — R351 Nocturia: Secondary | ICD-10-CM | POA: Diagnosis not present

## 2018-07-03 DIAGNOSIS — E291 Testicular hypofunction: Secondary | ICD-10-CM | POA: Diagnosis not present

## 2018-07-03 DIAGNOSIS — N401 Enlarged prostate with lower urinary tract symptoms: Secondary | ICD-10-CM | POA: Diagnosis not present

## 2018-07-10 DIAGNOSIS — Z23 Encounter for immunization: Secondary | ICD-10-CM | POA: Diagnosis not present

## 2018-08-07 DIAGNOSIS — E291 Testicular hypofunction: Secondary | ICD-10-CM | POA: Diagnosis not present

## 2018-09-22 DIAGNOSIS — M5136 Other intervertebral disc degeneration, lumbar region: Secondary | ICD-10-CM | POA: Diagnosis not present

## 2018-09-22 DIAGNOSIS — M47896 Other spondylosis, lumbar region: Secondary | ICD-10-CM | POA: Diagnosis not present

## 2018-09-22 DIAGNOSIS — M545 Low back pain: Secondary | ICD-10-CM | POA: Diagnosis not present

## 2018-10-02 DIAGNOSIS — M545 Low back pain: Secondary | ICD-10-CM | POA: Diagnosis not present

## 2018-10-16 DIAGNOSIS — M545 Low back pain: Secondary | ICD-10-CM | POA: Diagnosis not present

## 2018-11-11 DIAGNOSIS — R030 Elevated blood-pressure reading, without diagnosis of hypertension: Secondary | ICD-10-CM | POA: Diagnosis not present

## 2018-11-11 DIAGNOSIS — R05 Cough: Secondary | ICD-10-CM | POA: Diagnosis not present

## 2018-11-11 DIAGNOSIS — J069 Acute upper respiratory infection, unspecified: Secondary | ICD-10-CM | POA: Diagnosis not present

## 2018-11-27 DIAGNOSIS — M545 Low back pain: Secondary | ICD-10-CM | POA: Diagnosis not present

## 2018-12-09 DIAGNOSIS — Z85828 Personal history of other malignant neoplasm of skin: Secondary | ICD-10-CM | POA: Diagnosis not present

## 2018-12-09 DIAGNOSIS — L814 Other melanin hyperpigmentation: Secondary | ICD-10-CM | POA: Diagnosis not present

## 2018-12-09 DIAGNOSIS — L821 Other seborrheic keratosis: Secondary | ICD-10-CM | POA: Diagnosis not present

## 2019-02-05 DIAGNOSIS — E291 Testicular hypofunction: Secondary | ICD-10-CM | POA: Diagnosis not present

## 2019-02-05 DIAGNOSIS — N401 Enlarged prostate with lower urinary tract symptoms: Secondary | ICD-10-CM | POA: Diagnosis not present

## 2019-02-05 DIAGNOSIS — R351 Nocturia: Secondary | ICD-10-CM | POA: Diagnosis not present

## 2019-02-12 DIAGNOSIS — E291 Testicular hypofunction: Secondary | ICD-10-CM | POA: Diagnosis not present

## 2019-08-24 ENCOUNTER — Other Ambulatory Visit: Payer: Self-pay | Admitting: Otolaryngology

## 2019-08-24 DIAGNOSIS — D480 Neoplasm of uncertain behavior of bone and articular cartilage: Secondary | ICD-10-CM

## 2019-08-27 ENCOUNTER — Other Ambulatory Visit: Payer: Self-pay

## 2019-08-27 ENCOUNTER — Ambulatory Visit
Admission: RE | Admit: 2019-08-27 | Discharge: 2019-08-27 | Disposition: A | Discharge status: Home / Self Care | Payer: PRIVATE HEALTH INSURANCE | Source: Ambulatory Visit | Attending: Otolaryngology | Admitting: Otolaryngology

## 2019-08-27 DIAGNOSIS — D480 Neoplasm of uncertain behavior of bone and articular cartilage: Secondary | ICD-10-CM

## 2019-08-27 MED ORDER — IOPAMIDOL (ISOVUE-300) INJECTION 61%
75.0000 mL | Freq: Once | INTRAVENOUS | Status: AC | PRN
  Administered 2019-08-27: 75 mL via INTRAVENOUS

## 2019-11-16 DIAGNOSIS — M48061 Spinal stenosis, lumbar region without neurogenic claudication: Secondary | ICD-10-CM | POA: Diagnosis not present

## 2019-11-16 DIAGNOSIS — M7062 Trochanteric bursitis, left hip: Secondary | ICD-10-CM | POA: Diagnosis not present

## 2019-11-16 DIAGNOSIS — M545 Low back pain: Secondary | ICD-10-CM | POA: Diagnosis not present

## 2019-11-16 DIAGNOSIS — M25552 Pain in left hip: Secondary | ICD-10-CM | POA: Diagnosis not present

## 2019-12-22 DIAGNOSIS — L570 Actinic keratosis: Secondary | ICD-10-CM | POA: Diagnosis not present

## 2019-12-22 DIAGNOSIS — L821 Other seborrheic keratosis: Secondary | ICD-10-CM | POA: Diagnosis not present

## 2019-12-22 DIAGNOSIS — Z85828 Personal history of other malignant neoplasm of skin: Secondary | ICD-10-CM | POA: Diagnosis not present

## 2019-12-22 DIAGNOSIS — D225 Melanocytic nevi of trunk: Secondary | ICD-10-CM | POA: Diagnosis not present

## 2019-12-22 DIAGNOSIS — B078 Other viral warts: Secondary | ICD-10-CM | POA: Diagnosis not present

## 2019-12-22 DIAGNOSIS — L814 Other melanin hyperpigmentation: Secondary | ICD-10-CM | POA: Diagnosis not present

## 2020-01-28 DIAGNOSIS — M79644 Pain in right finger(s): Secondary | ICD-10-CM | POA: Diagnosis not present

## 2020-01-28 DIAGNOSIS — M65331 Trigger finger, right middle finger: Secondary | ICD-10-CM | POA: Diagnosis not present

## 2020-01-28 DIAGNOSIS — E291 Testicular hypofunction: Secondary | ICD-10-CM | POA: Diagnosis not present

## 2020-02-11 DIAGNOSIS — N5201 Erectile dysfunction due to arterial insufficiency: Secondary | ICD-10-CM | POA: Diagnosis not present

## 2020-02-11 DIAGNOSIS — E291 Testicular hypofunction: Secondary | ICD-10-CM | POA: Diagnosis not present

## 2020-04-21 DIAGNOSIS — M545 Low back pain: Secondary | ICD-10-CM | POA: Diagnosis not present

## 2020-04-27 DIAGNOSIS — M7062 Trochanteric bursitis, left hip: Secondary | ICD-10-CM | POA: Diagnosis not present

## 2020-04-27 DIAGNOSIS — M545 Low back pain: Secondary | ICD-10-CM | POA: Diagnosis not present

## 2020-04-27 DIAGNOSIS — M25552 Pain in left hip: Secondary | ICD-10-CM | POA: Diagnosis not present

## 2020-04-27 DIAGNOSIS — M48061 Spinal stenosis, lumbar region without neurogenic claudication: Secondary | ICD-10-CM | POA: Diagnosis not present

## 2020-05-12 DIAGNOSIS — D485 Neoplasm of uncertain behavior of skin: Secondary | ICD-10-CM | POA: Diagnosis not present

## 2020-05-12 DIAGNOSIS — M65331 Trigger finger, right middle finger: Secondary | ICD-10-CM | POA: Diagnosis not present

## 2020-05-17 DIAGNOSIS — D170 Benign lipomatous neoplasm of skin and subcutaneous tissue of head, face and neck: Secondary | ICD-10-CM | POA: Diagnosis not present

## 2020-05-20 DIAGNOSIS — M5136 Other intervertebral disc degeneration, lumbar region: Secondary | ICD-10-CM | POA: Diagnosis not present

## 2020-05-25 DIAGNOSIS — M25552 Pain in left hip: Secondary | ICD-10-CM | POA: Diagnosis not present

## 2020-05-25 DIAGNOSIS — M545 Low back pain: Secondary | ICD-10-CM | POA: Diagnosis not present

## 2020-05-25 DIAGNOSIS — M5136 Other intervertebral disc degeneration, lumbar region: Secondary | ICD-10-CM | POA: Diagnosis not present

## 2020-06-02 DIAGNOSIS — M25552 Pain in left hip: Secondary | ICD-10-CM | POA: Diagnosis not present

## 2020-06-07 DIAGNOSIS — M25552 Pain in left hip: Secondary | ICD-10-CM | POA: Diagnosis not present

## 2020-06-23 DIAGNOSIS — M25552 Pain in left hip: Secondary | ICD-10-CM | POA: Diagnosis not present

## 2020-07-01 DIAGNOSIS — Z23 Encounter for immunization: Secondary | ICD-10-CM | POA: Diagnosis not present

## 2020-07-11 DIAGNOSIS — M65331 Trigger finger, right middle finger: Secondary | ICD-10-CM | POA: Diagnosis not present

## 2020-07-17 DIAGNOSIS — D170 Benign lipomatous neoplasm of skin and subcutaneous tissue of head, face and neck: Secondary | ICD-10-CM | POA: Diagnosis not present

## 2020-07-17 DIAGNOSIS — R220 Localized swelling, mass and lump, head: Secondary | ICD-10-CM | POA: Diagnosis not present

## 2020-07-20 DIAGNOSIS — K625 Hemorrhage of anus and rectum: Secondary | ICD-10-CM | POA: Diagnosis not present

## 2020-07-20 DIAGNOSIS — Z79899 Other long term (current) drug therapy: Secondary | ICD-10-CM | POA: Diagnosis not present

## 2020-07-20 DIAGNOSIS — K219 Gastro-esophageal reflux disease without esophagitis: Secondary | ICD-10-CM | POA: Diagnosis not present

## 2020-07-20 DIAGNOSIS — E559 Vitamin D deficiency, unspecified: Secondary | ICD-10-CM | POA: Diagnosis not present

## 2020-07-25 DIAGNOSIS — M25641 Stiffness of right hand, not elsewhere classified: Secondary | ICD-10-CM | POA: Diagnosis not present

## 2020-07-27 DIAGNOSIS — M79644 Pain in right finger(s): Secondary | ICD-10-CM | POA: Diagnosis not present

## 2020-07-28 DIAGNOSIS — Z1159 Encounter for screening for other viral diseases: Secondary | ICD-10-CM | POA: Diagnosis not present

## 2020-08-01 DIAGNOSIS — H6123 Impacted cerumen, bilateral: Secondary | ICD-10-CM | POA: Diagnosis not present

## 2020-08-01 DIAGNOSIS — M79644 Pain in right finger(s): Secondary | ICD-10-CM | POA: Diagnosis not present

## 2020-08-02 DIAGNOSIS — K219 Gastro-esophageal reflux disease without esophagitis: Secondary | ICD-10-CM | POA: Diagnosis not present

## 2020-08-02 DIAGNOSIS — K625 Hemorrhage of anus and rectum: Secondary | ICD-10-CM | POA: Diagnosis not present

## 2020-08-02 DIAGNOSIS — K649 Unspecified hemorrhoids: Secondary | ICD-10-CM | POA: Diagnosis not present

## 2020-08-02 DIAGNOSIS — D122 Benign neoplasm of ascending colon: Secondary | ICD-10-CM | POA: Diagnosis not present

## 2020-08-03 DIAGNOSIS — M79644 Pain in right finger(s): Secondary | ICD-10-CM | POA: Diagnosis not present

## 2020-08-08 DIAGNOSIS — M79644 Pain in right finger(s): Secondary | ICD-10-CM | POA: Diagnosis not present

## 2020-08-10 DIAGNOSIS — M79644 Pain in right finger(s): Secondary | ICD-10-CM | POA: Diagnosis not present

## 2020-08-11 DIAGNOSIS — H348321 Tributary (branch) retinal vein occlusion, left eye, with retinal neovascularization: Secondary | ICD-10-CM | POA: Diagnosis not present

## 2020-08-14 DIAGNOSIS — H35033 Hypertensive retinopathy, bilateral: Secondary | ICD-10-CM | POA: Diagnosis not present

## 2020-08-14 DIAGNOSIS — H3582 Retinal ischemia: Secondary | ICD-10-CM | POA: Diagnosis not present

## 2020-08-14 DIAGNOSIS — H348322 Tributary (branch) retinal vein occlusion, left eye, stable: Secondary | ICD-10-CM | POA: Diagnosis not present

## 2020-08-14 DIAGNOSIS — H35373 Puckering of macula, bilateral: Secondary | ICD-10-CM | POA: Diagnosis not present

## 2020-08-25 DIAGNOSIS — E7800 Pure hypercholesterolemia, unspecified: Secondary | ICD-10-CM | POA: Diagnosis not present

## 2020-08-25 DIAGNOSIS — I10 Essential (primary) hypertension: Secondary | ICD-10-CM | POA: Diagnosis not present

## 2020-08-25 DIAGNOSIS — Z0000 Encounter for general adult medical examination without abnormal findings: Secondary | ICD-10-CM | POA: Diagnosis not present

## 2020-08-25 DIAGNOSIS — D751 Secondary polycythemia: Secondary | ICD-10-CM | POA: Diagnosis not present

## 2020-09-01 DIAGNOSIS — I10 Essential (primary) hypertension: Secondary | ICD-10-CM | POA: Diagnosis not present

## 2020-10-13 DIAGNOSIS — E7800 Pure hypercholesterolemia, unspecified: Secondary | ICD-10-CM | POA: Diagnosis not present

## 2020-12-07 DIAGNOSIS — H348322 Tributary (branch) retinal vein occlusion, left eye, stable: Secondary | ICD-10-CM | POA: Diagnosis not present

## 2020-12-07 DIAGNOSIS — H35033 Hypertensive retinopathy, bilateral: Secondary | ICD-10-CM | POA: Diagnosis not present

## 2020-12-07 DIAGNOSIS — H35372 Puckering of macula, left eye: Secondary | ICD-10-CM | POA: Diagnosis not present

## 2020-12-07 DIAGNOSIS — H3582 Retinal ischemia: Secondary | ICD-10-CM | POA: Diagnosis not present

## 2020-12-27 DIAGNOSIS — Z85828 Personal history of other malignant neoplasm of skin: Secondary | ICD-10-CM | POA: Diagnosis not present

## 2020-12-27 DIAGNOSIS — D225 Melanocytic nevi of trunk: Secondary | ICD-10-CM | POA: Diagnosis not present

## 2020-12-27 DIAGNOSIS — L814 Other melanin hyperpigmentation: Secondary | ICD-10-CM | POA: Diagnosis not present

## 2020-12-27 DIAGNOSIS — L578 Other skin changes due to chronic exposure to nonionizing radiation: Secondary | ICD-10-CM | POA: Diagnosis not present

## 2020-12-27 DIAGNOSIS — L570 Actinic keratosis: Secondary | ICD-10-CM | POA: Diagnosis not present

## 2020-12-27 DIAGNOSIS — L821 Other seborrheic keratosis: Secondary | ICD-10-CM | POA: Diagnosis not present

## 2021-02-02 DIAGNOSIS — E7800 Pure hypercholesterolemia, unspecified: Secondary | ICD-10-CM | POA: Diagnosis not present

## 2021-02-02 DIAGNOSIS — E291 Testicular hypofunction: Secondary | ICD-10-CM | POA: Diagnosis not present

## 2021-02-02 DIAGNOSIS — I10 Essential (primary) hypertension: Secondary | ICD-10-CM | POA: Diagnosis not present

## 2021-02-09 DIAGNOSIS — I10 Essential (primary) hypertension: Secondary | ICD-10-CM | POA: Diagnosis not present

## 2021-03-02 DIAGNOSIS — E291 Testicular hypofunction: Secondary | ICD-10-CM | POA: Diagnosis not present

## 2021-03-02 DIAGNOSIS — N401 Enlarged prostate with lower urinary tract symptoms: Secondary | ICD-10-CM | POA: Diagnosis not present

## 2021-03-02 DIAGNOSIS — R351 Nocturia: Secondary | ICD-10-CM | POA: Diagnosis not present

## 2021-03-09 DIAGNOSIS — N5201 Erectile dysfunction due to arterial insufficiency: Secondary | ICD-10-CM | POA: Diagnosis not present

## 2021-03-09 DIAGNOSIS — E291 Testicular hypofunction: Secondary | ICD-10-CM | POA: Diagnosis not present

## 2021-03-20 DIAGNOSIS — U071 COVID-19: Secondary | ICD-10-CM | POA: Diagnosis not present

## 2021-04-06 DIAGNOSIS — D696 Thrombocytopenia, unspecified: Secondary | ICD-10-CM | POA: Diagnosis not present

## 2021-04-06 DIAGNOSIS — K219 Gastro-esophageal reflux disease without esophagitis: Secondary | ICD-10-CM | POA: Diagnosis not present

## 2021-04-06 DIAGNOSIS — E559 Vitamin D deficiency, unspecified: Secondary | ICD-10-CM | POA: Diagnosis not present

## 2021-04-06 DIAGNOSIS — I10 Essential (primary) hypertension: Secondary | ICD-10-CM | POA: Diagnosis not present

## 2021-04-06 DIAGNOSIS — R7989 Other specified abnormal findings of blood chemistry: Secondary | ICD-10-CM | POA: Diagnosis not present

## 2021-04-15 IMAGING — CT CT MAXILLOFACIAL W/ CM
1 series · 16 of 30 positions shown, 20 images · IV contrast (APPLIED)
Comparison: None.

CLINICAL DATA: Right frontal mass

EXAM:
CT MAXILLOFACIAL WITH CONTRAST
TECHNIQUE: Multidetector CT imaging of the maxillofacial structures was
performed with intravenous contrast. Multiplanar CT image
reconstructions were also generated.
CONTRAST:  75mL [id number] IOPAMIDOL (JTN51S-F11) INJECTION 61%

[Series 4: maxofacial -soft · axial · 0.32mm/px · z∈[-243,-81]mm · 16 of 88 slices shown, 20 images]
[im 4/88  brain]
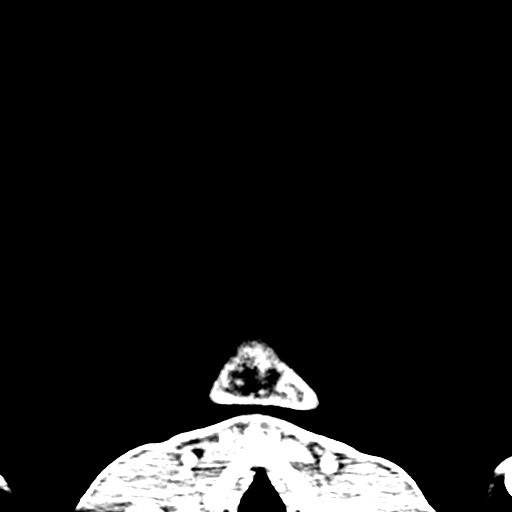
[im 4/88  bone]
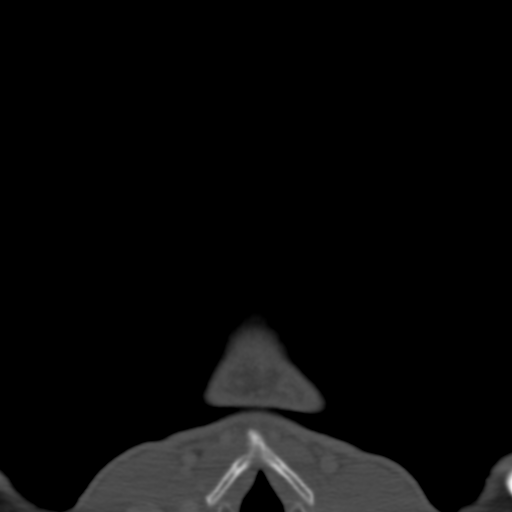
[im 10/88  bone]
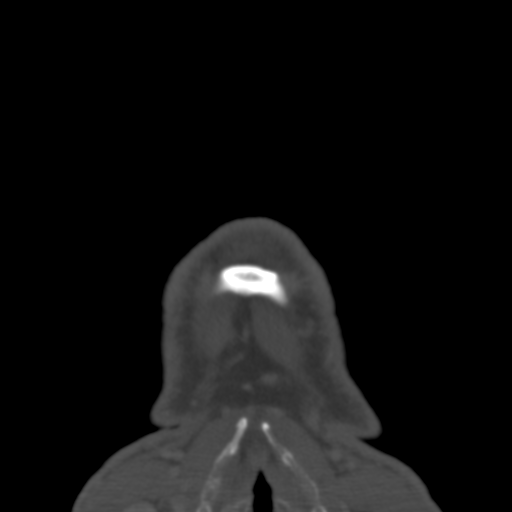
[im 16/88  bone]
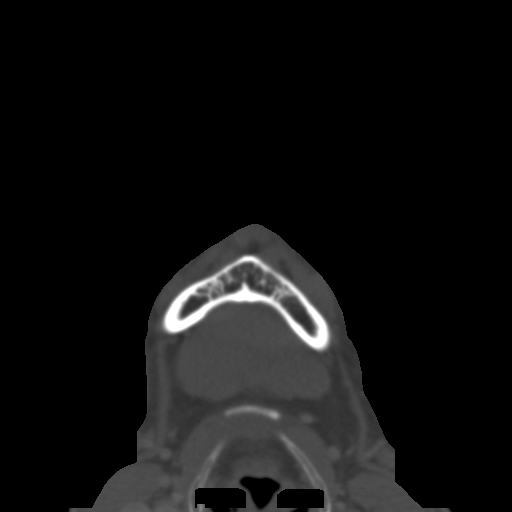
[im 22/88  bone]
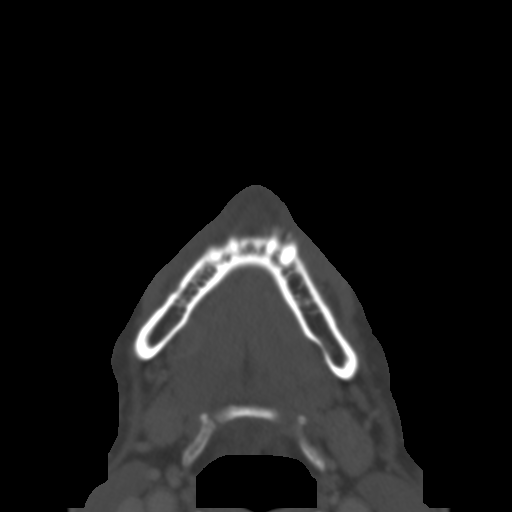
[im 25/88  brain]
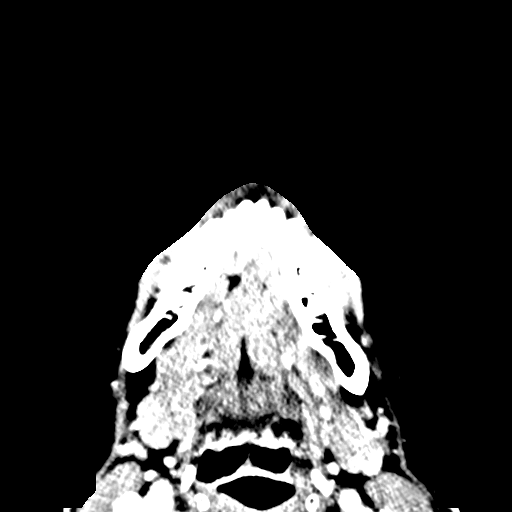
[im 25/88  bone]
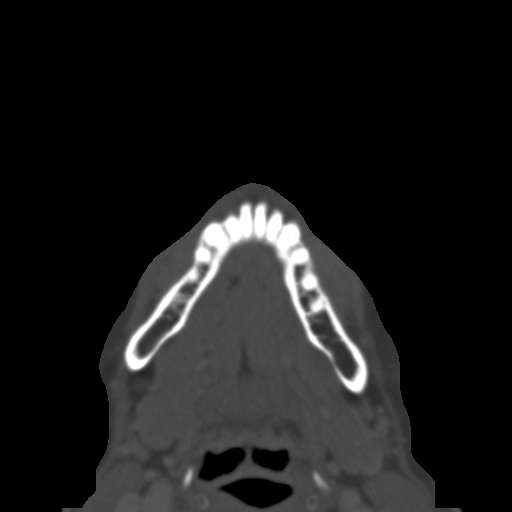
[im 31/88  bone]
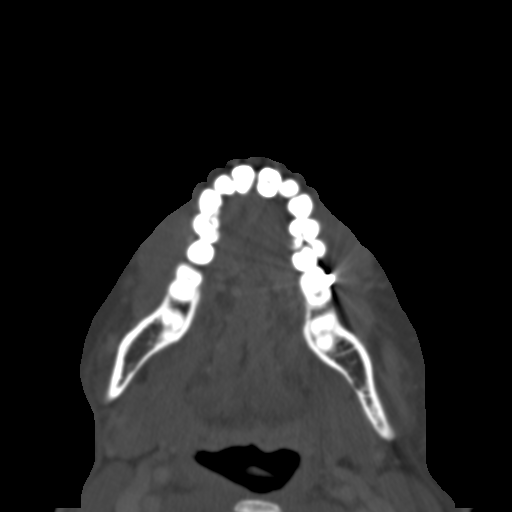
[im 37/88  bone]
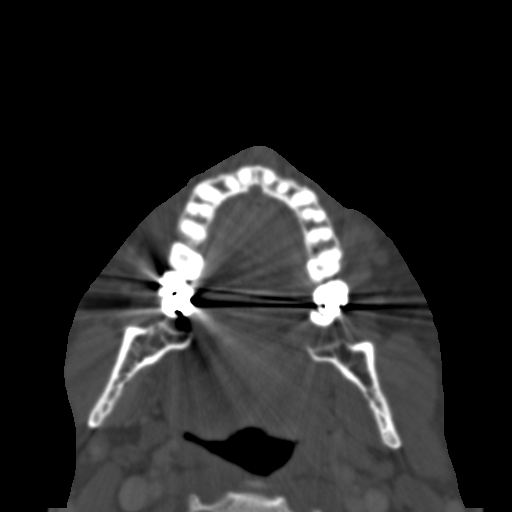
[im 43/88  bone]
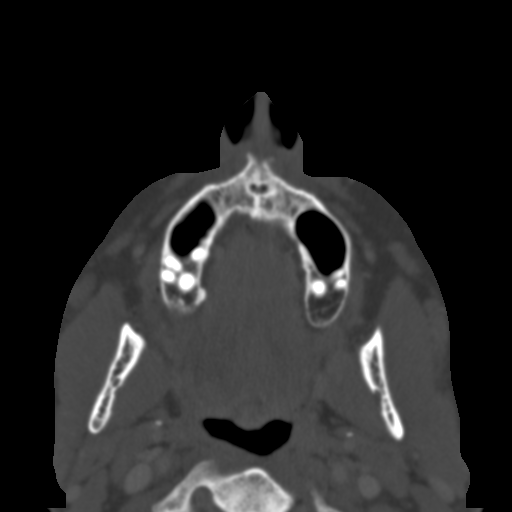
[im 46/88  brain]
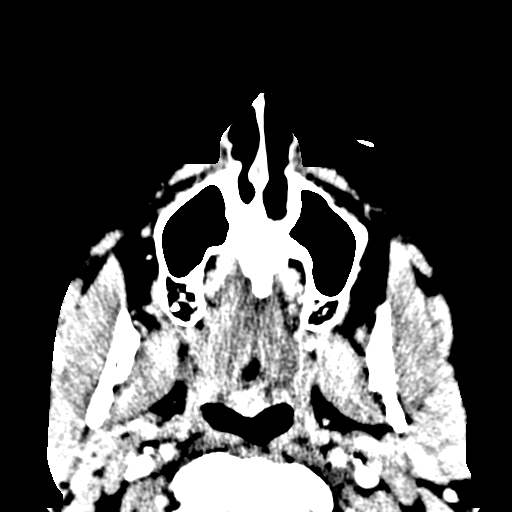
[im 46/88  bone]
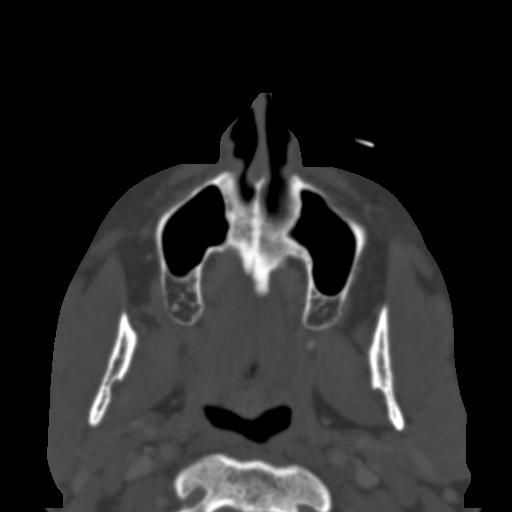
[im 52/88  bone]
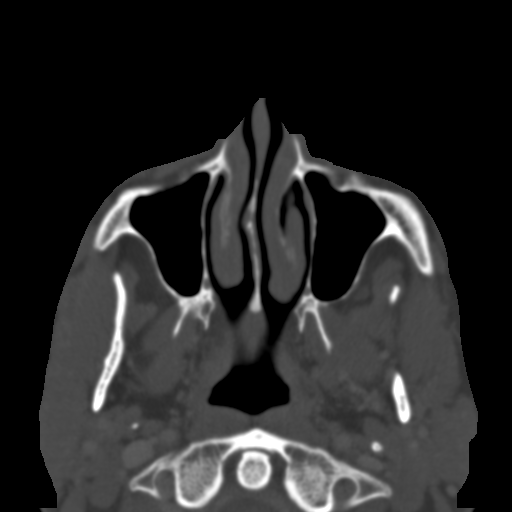
[im 58/88  bone]
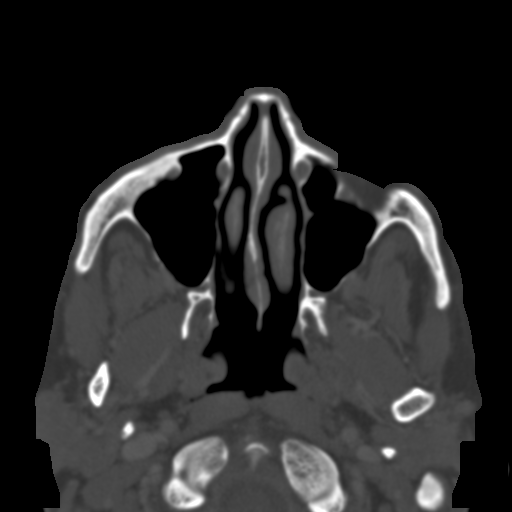
[im 64/88  bone]
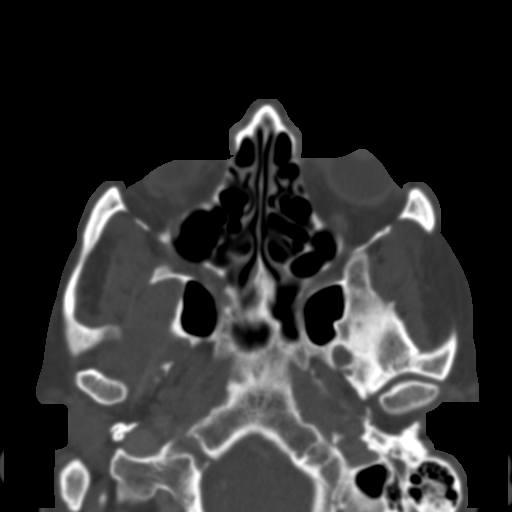
[im 67/88  brain]
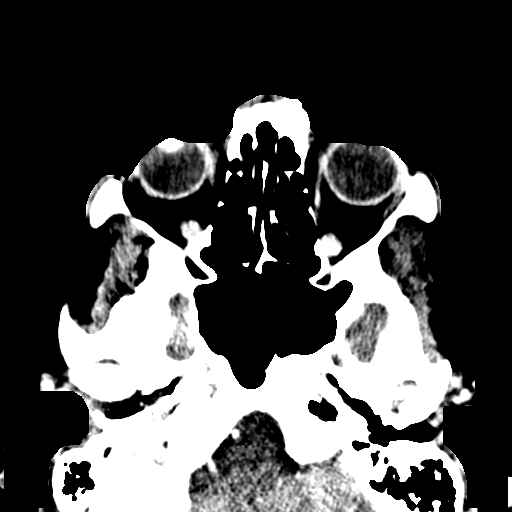
[im 67/88  bone]
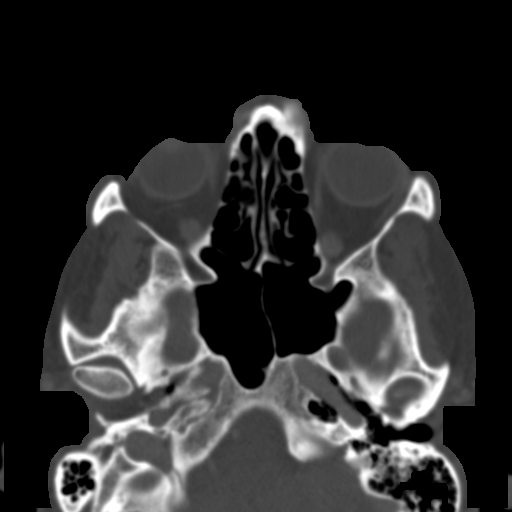
[im 73/88  bone]
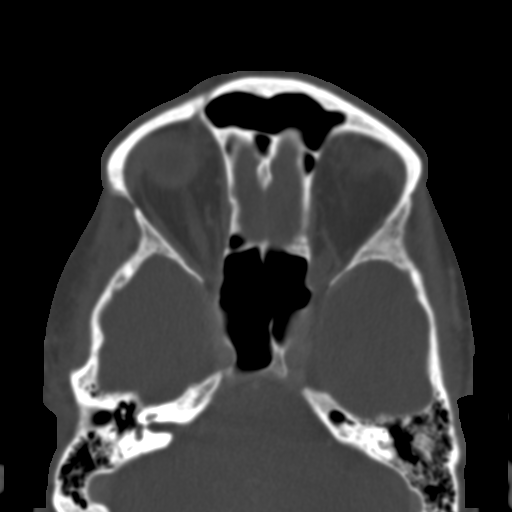
[im 79/88  bone]
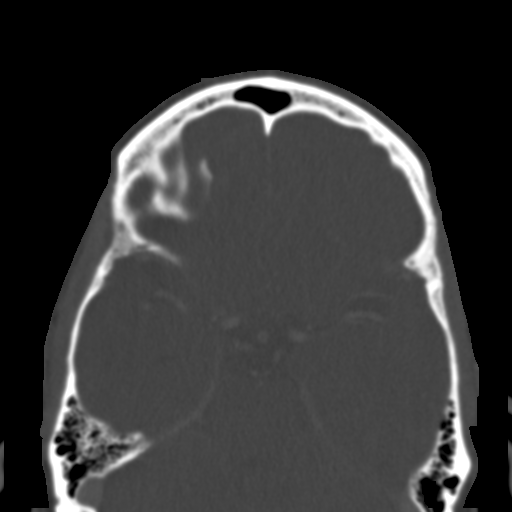
[im 85/88  bone]
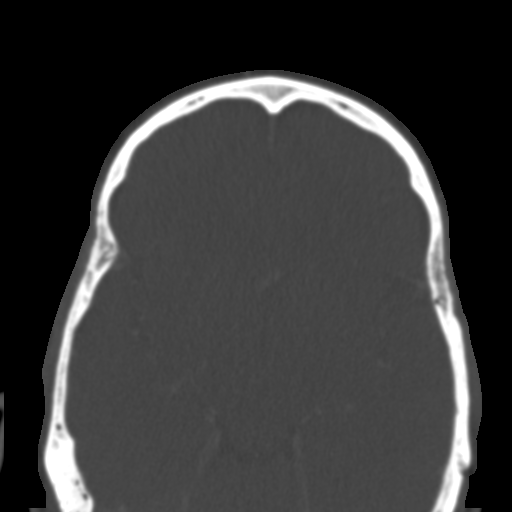

[16 of 30 positions shown; findings below may reference images not displayed]

FINDINGS: Osseous: Unremarkable.  No aggressive osseous lesion.

Orbits: Unremarkable.

Sinuses: Minimal mucosal thickening. Slight rightward nasal septal
deviation.

Soft tissues: A skin marker overlies the supraorbital right frontal
scalp. There is relatively prominent fat density in this region
likely reflecting a lipoma.

Limited intracranial: No abnormal enhancement.
IMPRESSION: Small right frontal scalp lipoma in the region of palpable
abnormality.

## 2022-08-12 ENCOUNTER — Ambulatory Visit: Payer: Self-pay | Admitting: Surgery

## 2022-08-30 NOTE — Patient Instructions (Signed)
SURGICAL WAITING ROOM VISITATION Patients having surgery or a procedure may have no more than 2 support people in the waiting area - these visitors may rotate.   Children under the age of 50 must have an adult with them who is not the patient. If the patient needs to stay at the hospital during part of their recovery, the visitor guidelines for inpatient rooms apply. Pre-op nurse will coordinate an appropriate time for 1 support person to accompany patient in pre-op.  This support person may not rotate.    Please refer to the [facility] website for the visitor guidelines for Inpatients (after your surgery is over and you are in a regular room).      Your procedure is scheduled on: 09-20-22   Report to [facility] Main Entrance    Report to admitting at 5:15 AM   Call this number if you have problems the morning of surgery [phone]   Follow a liquid or pureed diet the day before surgery   Do not eat food :After Midnight.   After Midnight you may have the following liquids until 4:30 AM DAY OF SURGERY  Water Non-Citrus Juices (without pulp, NO RED) Carbonated Beverages Black Coffee (NO MILK/CREAM OR CREAMERS, sugar ok)  Clear Tea (NO MILK/CREAM OR CREAMERS, sugar ok) regular and decaf                             Plain Jell-O (NO RED)                                           Fruit ices (not with fruit pulp, NO RED)                                     Popsicles (NO RED)                                                               Sports drinks like Gatorade (NO RED)                   The day of surgery:  Drink ONE (1) Pre-Surgery Clear Ensure at 4:30 AM the morning of surgery. Drink in one sitting. Do not sip.  This drink was given to you during your hospital  pre-op appointment visit. Nothing else to drink after completing the Pre-Surgery Clear Ensure.          If you have questions, please contact your surgeon's office.   FOLLOW BOWEL PREP AND ANY ADDITIONAL  PRE OP INSTRUCTIONS YOU RECEIVED FROM YOUR SURGEON'S OFFICE!!!  -Follow a liquid or pureed diet the day before surgery  -Take 2 ox of Milk of Magnesia at 10 am and 2 pm     Oral Hygiene is also important to reduce your risk of infection.                                    Remember - BRUSH YOUR TEETH THE MORNING  OF SURGERY WITH YOUR REGULAR TOOTHPASTE   Do NOT smoke after Midnight   Take these medicines the morning of surgery with A SIP OF WATER:   Pantoprazole  DO NOT TAKE ANY ORAL DIABETIC MEDICATIONS DAY OF YOUR SURGERY  Bring CPAP mask and tubing day of surgery.                              You may not have any metal on your body including  jewelry, and body piercing             Do not wear  lotions, powders, cologne, or deodorant              Men may shave face and neck.   Do not bring valuables to the hospital. [place].   Contacts, dentures or bridgework may not be worn into surgery.  DO NOT [place]. PHARMACY WILL DISPENSE MEDICATIONS LISTED ON YOUR MEDICATION LIST TO YOU DURING YOUR ADMISSION [place]!    Patients discharged on the day of surgery will not be allowed to drive home.  Someone NEEDS to stay with you for the first 24 hours after anesthesia.   Special Instructions: Bring a copy of your healthcare power of attorney and living will documents the day of surgery if you haven't scanned them before.              Please read over the following fact sheets you were given: IF [place]  If you received a COVID test during your pre-op visit  it is requested that you wear a mask when out in public, stay away from anyone that may not be feeling well and notify your surgeon if you develop symptoms. If you test positive for Covid or have been in contact with anyone that has tested positive in the last 10 days please notify you  surgeon.  [facility] - Preparing for Surgery Before surgery, you can play an important role.  Because skin is not sterile, your skin needs to be as free of germs as possible.  You can reduce the number of germs on your skin by washing with CHG (chlorahexidine gluconate) soap before surgery.  CHG is an antiseptic cleaner which kills germs and bonds with the skin to continue killing germs even after washing. Please DO NOT use if you have an allergy to CHG or antibacterial soaps.  If your skin becomes reddened/irritated stop using the CHG and inform your nurse when you arrive at Short Stay. Do not shave (including legs and underarms) for at least 48 hours prior to the first CHG shower.  You may shave your face/neck.  Please follow these instructions carefully:  1.  Shower with CHG Soap the night before surgery and the  morning of surgery.  2.  If you choose to wash your hair, wash your hair first as usual with your normal  shampoo.  3.  After you shampoo, rinse your hair and body thoroughly to remove the shampoo.                             4.  Use CHG as you would any other liquid soap.  You can apply chg directly to the skin and wash.  Gently with a scrungie  or clean washcloth.  5.  Apply the CHG Soap to your body ONLY FROM THE NECK DOWN.   Do   not use on face/ open                           Wound or open sores. Avoid contact with eyes, ears mouth and   genitals (private parts).                       Wash face,  Genitals (private parts) with your normal soap.             6.  Wash thoroughly, paying special attention to the area where your    surgery  will be performed.  7.  Thoroughly rinse your body with warm water from the neck down.  8.  DO NOT shower/wash with your normal soap after using and rinsing off the CHG Soap.                9.  Pat yourself dry with a clean towel.            10.  Wear clean pajamas.            11.  Place clean sheets on your bed the night of your first shower and do  not  sleep with pets. Day of Surgery : Do not apply any lotions/deodorants the morning of surgery.  Please wear clean clothes to the hospital/surgery center.  FAILURE TO FOLLOW THESE INSTRUCTIONS MAY RESULT IN THE CANCELLATION OF YOUR SURGERY  PATIENT SIGNATURE_________________________________  NURSE SIGNATURE__________________________________  ________________________________________________________________________

## 2022-09-05 ENCOUNTER — Encounter (HOSPITAL_COMMUNITY)
Admission: RE | Admit: 2022-09-05 | Discharge: 2022-09-05 | Disposition: A | Discharge status: Home / Self Care | Payer: No Typology Code available for payment source | Source: Ambulatory Visit | Attending: Surgery | Admitting: Surgery

## 2022-09-05 ENCOUNTER — Other Ambulatory Visit: Payer: Self-pay

## 2022-09-05 ENCOUNTER — Encounter (HOSPITAL_COMMUNITY): Payer: Self-pay

## 2022-09-05 VITALS — BP 136/88 | HR 77 | Temp 98.4°F | Resp 16 | Ht 67.5 in | Wt 190.0 lb

## 2022-09-05 DIAGNOSIS — I2510 Atherosclerotic heart disease of native coronary artery without angina pectoris: Secondary | ICD-10-CM | POA: Diagnosis not present

## 2022-09-05 DIAGNOSIS — Z01818 Encounter for other preprocedural examination: Secondary | ICD-10-CM | POA: Insufficient documentation

## 2022-09-05 HISTORY — DX: Basal cell carcinoma of skin of other part of trunk: C44.519

## 2022-09-05 HISTORY — DX: Unspecified osteoarthritis, unspecified site: M19.90

## 2022-09-05 HISTORY — DX: Anemia, unspecified: D64.9

## 2022-09-05 HISTORY — DX: Unspecified right bundle-branch block: I45.10

## 2022-09-05 HISTORY — DX: Essential (primary) hypertension: I10

## 2022-09-05 HISTORY — DX: Unspecified hemorrhoids: K64.9

## 2022-09-05 HISTORY — DX: Personal history of urinary calculi: Z87.442

## 2022-09-05 NOTE — Progress Notes (Addendum)
COVID Vaccine Completed:  Yes  Date of COVID positive in last 90 days:  06/16/22  PCP - Sela Hilding, MD (note on chart) Cardiologist - Saw Dr. Wynonia Lawman 4 years ago for palpitations (Dr. Wynonia Lawman no longer in practice)  Chest x-ray - N/A EKG - 09-05-22 Epic Stress Test - 4 years ago ECHO - 4 years ago Cardiac Cath - N/A Pacemaker/ICD device last checked: Spinal Cord Stimulator:  Bowel Prep - Yes, pt aware   Sleep Study - N/A CPAP -   Fasting Blood Sugar - N/A Checks Blood Sugar _____ times a day  Last dose of GLP1 agonist-  N/A GLP1 instructions:  N/A   Last dose of SGLT-2 inhibitors-  N/A SGLT-2 instructions: N/A  Blood Thinner Instructions:  N/A Aspirin Instructions: Last Dose:  Activity level:  Can go up a flight of stairs and perform activities of daily living without stopping and without symptoms of chest pain or shortness of breath. Able to exercise without symptoms  Anesthesia review:  Hx of palpitations evaluated by Dr. Wynonia Lawman.  RBBB.  Patient states that the palpitations have resolved.  Patient denies shortness of breath, fever, cough and chest pain at PAT appointment  Patient verbalized understanding of instructions that were given to them at the PAT appointment. Patient was also instructed that they will need to review over the PAT instructions again at home before surgery.

## 2022-09-06 ENCOUNTER — Other Ambulatory Visit (HOSPITAL_COMMUNITY): Payer: PRIVATE HEALTH INSURANCE

## 2022-09-06 ENCOUNTER — Encounter (HOSPITAL_COMMUNITY): Payer: Self-pay

## 2022-09-19 ENCOUNTER — Encounter (HOSPITAL_COMMUNITY): Payer: Self-pay | Admitting: Surgery

## 2022-09-19 NOTE — Anesthesia Preprocedure Evaluation (Addendum)
Anesthesia Evaluation  Patient identified by MRN, date of birth, ID band Patient awake    Reviewed: Allergy & Precautions, H&P , NPO status , Patient's Chart, lab work & pertinent test results  Airway Mallampati: II  TM Distance: >3 FB Neck ROM: Full    Dental no notable dental hx. (+) Teeth Intact, Dental Advisory Given, Caps   Pulmonary neg pulmonary ROS, former smoker   Pulmonary exam normal breath sounds clear to auscultation       Cardiovascular Exercise Tolerance: Good hypertension, Pt. on medications negative cardio ROS Normal cardiovascular exam+ dysrhythmias  Rhythm:Regular Rate:Normal     Neuro/Psych negative neurological ROS  negative psych ROS   GI/Hepatic negative GI ROS, Neg liver ROS,GERD  Medicated,,  Endo/Other  negative endocrine ROS    Renal/GU negative Renal ROS  negative genitourinary   Musculoskeletal negative musculoskeletal ROS (+) Arthritis , Osteoarthritis,    Abdominal   Peds negative pediatric ROS (+)  Hematology negative hematology ROS (+) Blood dyscrasia, anemia   Anesthesia Other Findings   Reproductive/Obstetrics negative OB ROS                             Anesthesia Physical Anesthesia Plan  ASA: 2  Anesthesia Plan: General   Post-op Pain Management: Tylenol PO (pre-op)* and Celebrex PO (pre-op)*   Induction: Intravenous  PONV Risk Score and Plan: 2 and Ondansetron, Dexamethasone and Treatment may vary due to age or medical condition  Airway Management Planned: Oral ETT and LMA  Additional Equipment: None  Intra-op Plan:   Post-operative Plan: Extubation in OR  Informed Consent: I have reviewed the patients History and Physical, chart, labs and discussed the procedure including the risks, benefits and alternatives for the proposed anesthesia with the patient or authorized representative who has indicated his/her understanding and acceptance.        Plan Discussed with: Anesthesiologist and CRNA  Anesthesia Plan Comments: (  )        Anesthesia Quick Evaluation

## 2022-09-20 ENCOUNTER — Ambulatory Visit (HOSPITAL_COMMUNITY)
Admission: RE | Admit: 2022-09-20 | Discharge: 2022-09-20 | Disposition: A | Discharge status: Home / Self Care | Payer: No Typology Code available for payment source | Attending: Surgery | Admitting: Surgery

## 2022-09-20 ENCOUNTER — Other Ambulatory Visit: Payer: Self-pay

## 2022-09-20 ENCOUNTER — Ambulatory Visit (HOSPITAL_COMMUNITY): Payer: No Typology Code available for payment source | Admitting: Physician Assistant

## 2022-09-20 ENCOUNTER — Encounter (HOSPITAL_COMMUNITY): Payer: Self-pay | Admitting: Surgery

## 2022-09-20 ENCOUNTER — Encounter (HOSPITAL_COMMUNITY)
Admission: RE | Disposition: A | Discharge status: Home / Self Care | Payer: Self-pay | Source: Home / Self Care | Attending: Surgery

## 2022-09-20 ENCOUNTER — Ambulatory Visit (HOSPITAL_BASED_OUTPATIENT_CLINIC_OR_DEPARTMENT_OTHER): Payer: No Typology Code available for payment source | Admitting: Physician Assistant

## 2022-09-20 DIAGNOSIS — I4891 Unspecified atrial fibrillation: Secondary | ICD-10-CM

## 2022-09-20 DIAGNOSIS — K642 Third degree hemorrhoids: Secondary | ICD-10-CM | POA: Insufficient documentation

## 2022-09-20 DIAGNOSIS — Z87891 Personal history of nicotine dependence: Secondary | ICD-10-CM | POA: Diagnosis not present

## 2022-09-20 DIAGNOSIS — K641 Second degree hemorrhoids: Secondary | ICD-10-CM | POA: Diagnosis present

## 2022-09-20 DIAGNOSIS — N642 Atrophy of breast: Secondary | ICD-10-CM | POA: Diagnosis not present

## 2022-09-20 DIAGNOSIS — Z86010 Personal history of colonic polyps: Secondary | ICD-10-CM | POA: Insufficient documentation

## 2022-09-20 DIAGNOSIS — I10 Essential (primary) hypertension: Secondary | ICD-10-CM

## 2022-09-20 DIAGNOSIS — K219 Gastro-esophageal reflux disease without esophagitis: Secondary | ICD-10-CM | POA: Diagnosis not present

## 2022-09-20 HISTORY — PX: EVALUATION UNDER ANESTHESIA WITH HEMORRHOIDECTOMY: SHX5624

## 2022-09-20 SURGERY — EXAM UNDER ANESTHESIA WITH HEMORRHOIDECTOMY
Anesthesia: General

## 2022-09-20 MED ORDER — BUPIVACAINE-EPINEPHRINE (PF) 0.5% -1:200000 IJ SOLN
INTRAMUSCULAR | Status: AC
  Filled 2022-09-20: qty 30

## 2022-09-20 MED ORDER — OXYCODONE HCL 5 MG PO TABS: 5.0000 mg | ORAL_TABLET | Freq: Four times a day (QID) | ORAL | 0 refills | Status: AC | PRN

## 2022-09-20 MED ORDER — SODIUM CHLORIDE 0.9% FLUSH: 3.0000 mL | INTRAVENOUS | Status: DC | PRN

## 2022-09-20 MED ORDER — ESMOLOL HCL 100 MG/10ML IV SOLN
INTRAVENOUS | Status: AC
  Filled 2022-09-20: qty 10

## 2022-09-20 MED ORDER — DEXMEDETOMIDINE HCL IN NACL 80 MCG/20ML IV SOLN
INTRAVENOUS | Status: AC
  Filled 2022-09-20: qty 20

## 2022-09-20 MED ORDER — SODIUM CHLORIDE 0.9 % IV SOLN: 250.0000 mL | INTRAVENOUS | Status: DC | PRN

## 2022-09-20 MED ORDER — FENTANYL CITRATE PF 50 MCG/ML IJ SOSY
25.0000 ug | PREFILLED_SYRINGE | INTRAMUSCULAR | Status: DC | PRN
  Administered 2022-09-20: 50 ug via INTRAVENOUS

## 2022-09-20 MED ORDER — LACTATED RINGERS IV SOLN: INTRAVENOUS | Status: DC

## 2022-09-20 MED ORDER — BUPIVACAINE-EPINEPHRINE 0.5% -1:200000 IJ SOLN
INTRAMUSCULAR | Status: DC | PRN
  Administered 2022-09-20: 30 mL

## 2022-09-20 MED ORDER — OXYCODONE HCL 5 MG PO TABS: 5.0000 mg | ORAL_TABLET | Freq: Once | ORAL | Status: DC | PRN

## 2022-09-20 MED ORDER — BUPIVACAINE LIPOSOME 1.3 % IJ SUSP
INTRAMUSCULAR | Status: DC | PRN
  Administered 2022-09-20: 20 mL

## 2022-09-20 MED ORDER — DEXAMETHASONE SODIUM PHOSPHATE 10 MG/ML IJ SOLN
INTRAMUSCULAR | Status: DC | PRN
  Administered 2022-09-20: 10 mg via INTRAVENOUS

## 2022-09-20 MED ORDER — BUPIVACAINE LIPOSOME 1.3 % IJ SUSP: 20.0000 mL | Freq: Once | INTRAMUSCULAR | Status: DC

## 2022-09-20 MED ORDER — OXYCODONE HCL 5 MG PO TABS
5.0000 mg | ORAL_TABLET | ORAL | Status: DC | PRN
  Administered 2022-09-20: 5 mg via ORAL

## 2022-09-20 MED ORDER — PROPOFOL 10 MG/ML IV BOLUS
INTRAVENOUS | Status: AC
  Filled 2022-09-20: qty 20

## 2022-09-20 MED ORDER — MIDAZOLAM HCL 5 MG/5ML IJ SOLN
INTRAMUSCULAR | Status: DC | PRN
  Administered 2022-09-20: 2 mg via INTRAVENOUS

## 2022-09-20 MED ORDER — MIDAZOLAM HCL 2 MG/2ML IJ SOLN
INTRAMUSCULAR | Status: AC
  Filled 2022-09-20: qty 2

## 2022-09-20 MED ORDER — CHLORHEXIDINE GLUCONATE CLOTH 2 % EX PADS: 6.0000 | MEDICATED_PAD | Freq: Once | CUTANEOUS | Status: DC

## 2022-09-20 MED ORDER — ORAL CARE MOUTH RINSE: 15.0000 mL | Freq: Once | OROMUCOSAL | Status: AC

## 2022-09-20 MED ORDER — ROCURONIUM BROMIDE 10 MG/ML (PF) SYRINGE
PREFILLED_SYRINGE | INTRAVENOUS | Status: AC
  Filled 2022-09-20: qty 30

## 2022-09-20 MED ORDER — GABAPENTIN 300 MG PO CAPS
300.0000 mg | ORAL_CAPSULE | ORAL | Status: AC
  Administered 2022-09-20: 300 mg via ORAL
  Filled 2022-09-20: qty 1

## 2022-09-20 MED ORDER — ONDANSETRON HCL 4 MG/2ML IJ SOLN
INTRAMUSCULAR | Status: DC | PRN
  Administered 2022-09-20: 4 mg via INTRAVENOUS

## 2022-09-20 MED ORDER — PHENYLEPHRINE 80 MCG/ML (10ML) SYRINGE FOR IV PUSH (FOR BLOOD PRESSURE SUPPORT)
PREFILLED_SYRINGE | INTRAVENOUS | Status: DC | PRN
  Administered 2022-09-20: 120 ug via INTRAVENOUS
  Administered 2022-09-20: 80 ug via INTRAVENOUS

## 2022-09-20 MED ORDER — BUPIVACAINE LIPOSOME 1.3 % IJ SUSP
INTRAMUSCULAR | Status: AC
  Filled 2022-09-20: qty 20

## 2022-09-20 MED ORDER — DIAZEPAM 5 MG PO TABS: 5.0000 mg | ORAL_TABLET | Freq: Three times a day (TID) | ORAL | 1 refills | Status: AC | PRN

## 2022-09-20 MED ORDER — DEXMEDETOMIDINE HCL IN NACL 80 MCG/20ML IV SOLN
INTRAVENOUS | Status: DC | PRN
  Administered 2022-09-20: 8 ug via BUCCAL

## 2022-09-20 MED ORDER — ACETAMINOPHEN 160 MG/5ML PO SOLN: 325.0000 mg | ORAL | Status: DC | PRN

## 2022-09-20 MED ORDER — FENTANYL CITRATE PF 50 MCG/ML IJ SOSY
PREFILLED_SYRINGE | INTRAMUSCULAR | Status: AC
  Filled 2022-09-20: qty 1

## 2022-09-20 MED ORDER — DIAZEPAM 5 MG PO TABS
ORAL_TABLET | ORAL | Status: AC
  Filled 2022-09-20: qty 1

## 2022-09-20 MED ORDER — DIAZEPAM 2 MG PO TABS
5.0000 mg | ORAL_TABLET | Freq: Four times a day (QID) | ORAL | Status: DC | PRN
  Administered 2022-09-20: 5 mg via ORAL

## 2022-09-20 MED ORDER — ACETAMINOPHEN 325 MG PO TABS: 325.0000 mg | ORAL_TABLET | ORAL | Status: DC | PRN

## 2022-09-20 MED ORDER — OXYCODONE HCL 5 MG PO TABS
ORAL_TABLET | ORAL | Status: AC
  Filled 2022-09-20: qty 1

## 2022-09-20 MED ORDER — DEXAMETHASONE SODIUM PHOSPHATE 10 MG/ML IJ SOLN
INTRAMUSCULAR | Status: AC
  Filled 2022-09-20: qty 3

## 2022-09-20 MED ORDER — SUGAMMADEX SODIUM 200 MG/2ML IV SOLN
INTRAVENOUS | Status: DC | PRN
  Administered 2022-09-20: 400 mg via INTRAVENOUS

## 2022-09-20 MED ORDER — ACETAMINOPHEN 500 MG PO TABS
1000.0000 mg | ORAL_TABLET | ORAL | Status: AC
  Administered 2022-09-20: 1000 mg via ORAL
  Filled 2022-09-20: qty 2

## 2022-09-20 MED ORDER — ESMOLOL HCL 100 MG/10ML IV SOLN
INTRAVENOUS | Status: DC | PRN
  Administered 2022-09-20 (×2): 20 mg via INTRAVENOUS

## 2022-09-20 MED ORDER — FENTANYL CITRATE (PF) 100 MCG/2ML IJ SOLN
INTRAMUSCULAR | Status: AC
  Filled 2022-09-20: qty 2

## 2022-09-20 MED ORDER — SODIUM CHLORIDE 0.9% FLUSH: 3.0000 mL | Freq: Two times a day (BID) | INTRAVENOUS | Status: DC

## 2022-09-20 MED ORDER — LIDOCAINE 2% (20 MG/ML) 5 ML SYRINGE
INTRAMUSCULAR | Status: DC | PRN
  Administered 2022-09-20: 80 mg via INTRAVENOUS

## 2022-09-20 MED ORDER — OXYCODONE HCL 5 MG/5ML PO SOLN: 5.0000 mg | Freq: Once | ORAL | Status: DC | PRN

## 2022-09-20 MED ORDER — FENTANYL CITRATE (PF) 100 MCG/2ML IJ SOLN
INTRAMUSCULAR | Status: DC | PRN
  Administered 2022-09-20: 100 ug via INTRAVENOUS

## 2022-09-20 MED ORDER — LIDOCAINE HCL (PF) 2 % IJ SOLN
INTRAMUSCULAR | Status: AC
  Filled 2022-09-20: qty 15

## 2022-09-20 MED ORDER — SODIUM CHLORIDE 0.9 % IV SOLN
2.0000 g | INTRAVENOUS | Status: AC
  Administered 2022-09-20: 2 g via INTRAVENOUS
  Filled 2022-09-20: qty 20

## 2022-09-20 MED ORDER — PROPOFOL 10 MG/ML IV BOLUS
INTRAVENOUS | Status: DC | PRN
  Administered 2022-09-20: 200 mg via INTRAVENOUS

## 2022-09-20 MED ORDER — ONDANSETRON HCL 4 MG/2ML IJ SOLN
INTRAMUSCULAR | Status: AC
  Filled 2022-09-20: qty 6

## 2022-09-20 MED ORDER — CHLORHEXIDINE GLUCONATE 0.12 % MT SOLN
15.0000 mL | Freq: Once | OROMUCOSAL | Status: AC
  Administered 2022-09-20: 15 mL via OROMUCOSAL

## 2022-09-20 MED ORDER — BUPIVACAINE HCL (PF) 0.5 % IJ SOLN
INTRAMUSCULAR | Status: AC
  Filled 2022-09-20: qty 30

## 2022-09-20 MED ORDER — DIBUCAINE (PERIANAL) 1 % EX OINT
TOPICAL_OINTMENT | CUTANEOUS | Status: AC
  Filled 2022-09-20: qty 28

## 2022-09-20 MED ORDER — 0.9 % SODIUM CHLORIDE (POUR BTL) OPTIME
TOPICAL | Status: DC | PRN
  Administered 2022-09-20: 1000 mL

## 2022-09-20 MED ORDER — CELECOXIB 200 MG PO CAPS
200.0000 mg | ORAL_CAPSULE | ORAL | Status: AC
  Administered 2022-09-20: 200 mg via ORAL
  Filled 2022-09-20: qty 1

## 2022-09-20 MED ORDER — MEPERIDINE HCL 50 MG/ML IJ SOLN: 6.2500 mg | INTRAMUSCULAR | Status: DC | PRN

## 2022-09-20 MED ORDER — ENSURE PRE-SURGERY PO LIQD
296.0000 mL | Freq: Once | ORAL | Status: DC
  Filled 2022-09-20: qty 296

## 2022-09-20 MED ORDER — ONDANSETRON HCL 4 MG/2ML IJ SOLN: 4.0000 mg | Freq: Once | INTRAMUSCULAR | Status: DC | PRN

## 2022-09-20 MED ORDER — DIBUCAINE (PERIANAL) 1 % EX OINT
TOPICAL_OINTMENT | CUTANEOUS | Status: DC | PRN
  Administered 2022-09-20: 1 via RECTAL

## 2022-09-20 MED ORDER — ROCURONIUM BROMIDE 10 MG/ML (PF) SYRINGE
PREFILLED_SYRINGE | INTRAVENOUS | Status: DC | PRN
  Administered 2022-09-20: 10 mg via INTRAVENOUS
  Administered 2022-09-20: 60 mg via INTRAVENOUS

## 2022-09-20 SURGICAL SUPPLY — 38 items
APL SKNCLS STERI-STRIP NONHPOA (GAUZE/BANDAGES/DRESSINGS) ×1
BAG COUNTER SPONGE SURGICOUNT (BAG) IMPLANT
BAG SPNG CNTER NS LX DISP (BAG)
BENZOIN TINCTURE PRP APPL 2/3 (GAUZE/BANDAGES/DRESSINGS) ×2 IMPLANT
BLADE SURG 15 STRL LF DISP TIS (BLADE) IMPLANT
BLADE SURG 15 STRL SS (BLADE)
BRIEF MESH DISP LRG (UNDERPADS AND DIAPERS) ×2 IMPLANT
CNTNR URN SCR LID CUP LEK RST (MISCELLANEOUS) ×2 IMPLANT
CONT SPEC 4OZ STRL OR WHT (MISCELLANEOUS) ×1
COVER SURGICAL LIGHT HANDLE (MISCELLANEOUS) ×2 IMPLANT
DRAPE LAPAROTOMY T 102X78X121 (DRAPES) ×2 IMPLANT
ELECT REM PT RETURN 15FT ADLT (MISCELLANEOUS) ×2 IMPLANT
GAUZE 4X4 16PLY ~~LOC~~+RFID DBL (SPONGE) ×2 IMPLANT
GAUZE PAD ABD 8X10 STRL (GAUZE/BANDAGES/DRESSINGS) IMPLANT
GAUZE SPONGE 4X4 12PLY STRL (GAUZE/BANDAGES/DRESSINGS) IMPLANT
GLOVE ECLIPSE 8.0 STRL XLNG CF (GLOVE) ×2 IMPLANT
GLOVE INDICATOR 8.0 STRL GRN (GLOVE) ×2 IMPLANT
GOWN STRL REUS W/ TWL XL LVL3 (GOWN DISPOSABLE) ×6 IMPLANT
GOWN STRL REUS W/TWL XL LVL3 (GOWN DISPOSABLE) ×3
KIT BASIN OR (CUSTOM PROCEDURE TRAY) ×2 IMPLANT
KIT TURNOVER KIT A (KITS) IMPLANT
LOOP VESSEL MAXI BLUE (MISCELLANEOUS) IMPLANT
NEEDLE HYPO 22GX1.5 SAFETY (NEEDLE) ×2 IMPLANT
PACK BASIC VI WITH GOWN DISP (CUSTOM PROCEDURE TRAY) ×2 IMPLANT
PENCIL SMOKE EVACUATOR (MISCELLANEOUS) IMPLANT
SHEARS HARMONIC 9CM CVD (BLADE) IMPLANT
SPIKE FLUID TRANSFER (MISCELLANEOUS) ×2 IMPLANT
SURGILUBE 2OZ TUBE FLIPTOP (MISCELLANEOUS) ×2 IMPLANT
SUT CHROMIC 2 0 SH (SUTURE) ×2 IMPLANT
SUT CHROMIC 3 0 SH 27 (SUTURE) IMPLANT
SUT VIC AB 2-0 SH 27 (SUTURE)
SUT VIC AB 2-0 SH 27X BRD (SUTURE) IMPLANT
SUT VIC AB 2-0 UR6 27 (SUTURE) ×12 IMPLANT
SYR 20ML LL LF (SYRINGE) ×2 IMPLANT
SYR 3ML LL SCALE MARK (SYRINGE) IMPLANT
TOWEL OR 17X26 10 PK STRL BLUE (TOWEL DISPOSABLE) ×2 IMPLANT
TOWEL OR NON WOVEN STRL DISP B (DISPOSABLE) ×2 IMPLANT
YANKAUER SUCT BULB TIP 10FT TU (MISCELLANEOUS) ×2 IMPLANT

## 2022-09-20 NOTE — Anesthesia Procedure Notes (Signed)
Procedure Name: Intubation Date/Time: 09/20/2022 7:40 AM  Performed by: Lavina Hamman, CRNAPre-anesthesia Checklist: Patient identified, Emergency Drugs available, Suction available, Patient being monitored and Timeout performed Patient Re-evaluated:Patient Re-evaluated prior to induction Oxygen Delivery Method: Circle system utilized Preoxygenation: Pre-oxygenation with 100% oxygen Induction Type: IV induction Ventilation: Mask ventilation without difficulty Laryngoscope Size: Mac and 3 Grade View: Grade II Tube type: Oral Tube size: 7.5 mm Number of attempts: 1 Airway Equipment and Method: Stylet Placement Confirmation: ETT inserted through vocal cords under direct vision, positive ETCO2, CO2 detector and breath sounds checked- equal and bilateral Secured at: 23 cm Tube secured with: Tape Dental Injury: Teeth and Oropharynx as per pre-operative assessment  Comments: ATOI

## 2022-09-20 NOTE — Op Note (Signed)
09/20/2022  8:36 AM  PATIENT:  Jerry Roman  56 y.o. male  Patient Care Team: Glenis Smoker, MD as PCP - General (Family Medicine) Annia Belt, MD as Consulting Physician (Oncology) Ronald Lobo, MD as Consulting Physician (Gastroenterology) Carolan Clines, MD (Inactive) as Consulting Physician (Urology) Michael Boston, MD as Consulting Physician (General Surgery)  PRE-OPERATIVE DIAGNOSIS:  HEMORRHOIDS  POST-OPERATIVE DIAGNOSIS:  GRADE 2 & 3 HEMORRHOIDS  PROCEDURE:    Internal and external hemorrhoidectomy x2 Internal hemorrhoidal ligation and pexy Anorectal examination under anesthesia  SURGEON:  Adin Hector, MD  ANESTHESIA:   General Anorectal & Local field block (0.25% bupivacaine with epinephrine mixed with Liposomal bupivacaine (Experel)   EBL:  Total I/O In: -  Out: 10 [Blood:10].  See operative record  Delay start of Pharmacological VTE agent (>24hrs) due to surgical blood loss or risk of bleeding:  NO  DRAINS: NONE  SPECIMEN:   Internal & external hemorrhoid x2  DISPOSITION OF SPECIMEN:  PATHOLOGY  COUNTS:  YES  PLAN OF CARE: Discharge home after PACU  PATIENT DISPOSITION:  PACU - hemodynamically stable.  INDICATION: Pleasant patient with struggles with hemorrhoids.  Not able to be managed in the office despite an improved bowel regimen.  I recommended examination under anesthesia and surgical treatment:  The anatomy & physiology of the anorectal region was discussed.  The pathophysiology of hemorrhoids and differential diagnosis was discussed.  Natural history risks without surgery was discussed.   I stressed the importance of a bowel regimen to have daily soft bowel movements to minimize progression of disease.  Interventions such as sclerotherapy & banding were discussed.  The patient's symptoms are not adequately controlled by medicines and other non-operative treatments.  I feel the risks & problems of no surgery  outweigh the operative risks; therefore, I recommended surgery to treat the hemorrhoids by ligation, pexy, and possible resection.  Risks such as bleeding, infection, need for further treatment, heart attack, death, and other risks were discussed.   I noted a good likelihood this will help address the problem.  Goals of post-operative recovery were discussed as well.  Possibility that this will not correct all symptoms was explained.  Post-operative pain, bleeding, constipation, urinary difficulties, and other problems after surgery were discussed.  We will work to minimize complications.   Educational handouts further explaining the pathology, treatment options, and bowel regimen were given as well.  Questions were answered.  The patient expresses understanding & wishes to proceed with surgery.  OR FINDINGS: Very enlarged right anterior internal hemorrhoid least grade 3.  Left lateral grade 2/3.  Right posterior smallness grade 2.  No fissure, fistula, abscess.  Normal sphincter tone.  DESCRIPTION:   Informed consent was confirmed. Patient underwent general anesthesia without difficulty. Patient was placed into  prone/jacknife positioning.  The perianal region was prepped and draped in sterile fashion. Surgical time-out confirmed our plan.  I did digital rectal examination and then transitioned over to anoscopy to get a sense of the anatomy.  Findings noted above.   I proceeded to do hemorrhoidal ligation and pexy using a large self-retaining Parks retractor.  Occasionally alternating with a large Training and development officer.  I used a 2-0 Vicryl suture on a UR-6 needle in a figure-of-eight fashion 6 cm proximal to the anal verge.  I started at the largest hemorrhoid pile, right anterior..  Because of redundant hemorrhoidal tissue too bulky to merely ligate or pexy, I excised the excess internal hemorrhoid piles longitudinally in a  fusiform biconcave fashion, sparing the anal canal to avoid narrowing.  I  then ran that stitch longitudinally more distally to close the hemorrhoidectomy wound to the anal verge over a large Parks self-retaining anal retractor to avoid narrowing of the anal canal.  I then tied that stitch down to cause a hemorrhoidopexy.   I also had to do an excision at the  left lateral pile locations.  I then did hemorrhoidal ligation and pexy at the other 4 hemorrhoidal columns.  At the completion of this, all 6 anorectal columns were ligated and pexied in the classic hexagonal fashion (right anterior/lateral/posterior, left anterior/lateral/posterior).    I redid anoscopy and examination.  Hemostasis was good.  I radially trimmed and closed closed the external part of the right anterior hemorrhoidectomy wound with radial interrupted horizontal mattress 2-0 vicryl & chromic suture over a large Sawyer anal retractor, leaving the last 5 mm open to allow natural drainage.  I repeated anoscopy & examination.  At completion of this, all hemorrhoids had been removed or reduced into the rectum.  There is no more prolapse.  Internal & external anatomy was more more normal.  Hemostasis was good.  Fluffed gauze was on-laid over the perianal region.  No packing done.  Patient is being extubated go to go to the recovery room.  I had discussed postop care in detail with the patient in the preop holding area.  Instructions for post-operative recovery and prescriptions are written. I discussed operative findings, updated the patient's status, discussed probable steps to recovery, and gave postoperative recommendations to the patient's spouse, Jakyrie Totherow .  Recommendations were made.  Questions were answered.  She expressed understanding & appreciation.  Adin Hector, M.D., F.A.C.S. Gastrointestinal and Minimally Invasive Surgery [facility] [address], [place], [place] [address] [phone] Main / Paging

## 2022-09-20 NOTE — Transfer of Care (Signed)
Immediate Anesthesia Transfer of Care Note  Patient: Jerry Roman  Procedure(s) Performed: Procedure(s): ANORECTAL EXAM UNDER ANESTHESIA WITH HEMORRHOIDECTOMY WITH LIGATION AND HEMORRHOIDOPEXY (N/A)  Patient Location: PACU  Anesthesia Type:General  Level of Consciousness:  sedated, patient cooperative and responds to stimulation  Airway & Oxygen Therapy:Patient Spontanous Breathing and Patient connected to face mask oxgen  Post-op Assessment:  Report given to PACU RN and Post -op Vital signs reviewed and stable  Post vital signs:  Reviewed and stable  Last Vitals:  Vitals:   09/20/22 0536  BP: (!) 147/89  Pulse: 100  Resp: 16  Temp: 36.8 C  SpO2: 41%    Complications: No apparent anesthesia complications

## 2022-09-20 NOTE — H&P (Signed)
09/20/2022    REFERRING PHYSICIAN: Dawayne Cirri, PA  Patient Care Team: Warnell Bureau, MD as PCP - General (Family Medicine) Dawayne Cirri, [place] (Gastroenterology) Johney Maine, Adrian Saran, MD as Consulting Provider (General Surgery)  PROVIDER: Hollace Kinnier, MD  [facility] MRN: [id number] DOB: 08-04-1966  SUBJECTIVE   Chief Complaint: New Consultation (Hemorrhoidal bleeding)   History of Present Illness: Jerry Roman is a 56 y.o. male who is seen today  as an office consultation at the request of Dr. Donna Christen  for evaluation of New Consultation (Hemorrhoidal bleeding) .   Patient with history of intermittent rectal bleeding. Followed by [facility] gastrology. Primarily Dr. Cristina Gong who has since retired. Had colonoscopy in 2021 that showed a solitary sessile serrated polyp as well as some internal hemorrhoids. 5-year follow-up recommended = 2026. Has had some intermittent rectal bleeding. There is discussion about considering banding but I think they hold off. However it sounds like she had worsening symptoms. [facility] offered surgical consultation.  Medical History:  Past Medical History:  Diagnosis Date  GERD (gastroesophageal reflux disease)  Hyperlipidemia  Hypertension   Patient Active Problem List  Diagnosis  Prolapsed internal hemorrhoids, grade 3  Internal hemorrhoid, bleeding  History of colon polyps   History reviewed. No pertinent surgical history.   Not on File  Current Outpatient Medications on File Prior to Visit  Medication Sig Dispense Refill  cholecalciferol (VITAMIN D3) 1000 unit tablet Take by mouth  cyanocobalamin/folic acid (VITAMIN M21-JDBZM ACID) 0,802-233 mcg Lozg 1 tablet  DOCOSAHEXAENOIC ACID ORAL 1 capsule  FUSION 130 mg iron-25 mg-30 mg Cap TAKE 1 CAPSULE BETWEEN MEALS ORALLY TWICE A WEEK 90 DAYS  losartan (COZAAR) 100 MG tablet Take 100 mg by mouth once daily  omega-3 fatty acids-fish oil 300-1,000 mg capsule Take by mouth   pantoprazole (PROTONIX) 40 MG DR tablet Take 40 mg by mouth  rosuvastatin (CRESTOR) 10 MG tablet Take 10 mg by mouth once daily  tadalafiL (CIALIS) 10 MG tablet Take 10 mg by mouth  testosterone cypionate (DEPO-TESTOSTERONE) 200 mg/mL injection INJECT 1 ML IN THE MUSCLE EVERY 2 WEEKS  turmeric, bulk, 95 % Powd '1000mg'$    No current facility-administered medications on file prior to visit.   History reviewed. No pertinent family history.   Social History   Tobacco Use  Smoking Status Former  Types: Cigarettes  Smokeless Tobacco Never    Social History   Socioeconomic History  Marital status: Married  Tobacco Use  Smoking status: Former  Types: Cigarettes  Smokeless tobacco: Never  Substance and Sexual Activity  Alcohol use: Yes  Sexual activity: Never   ############################################################  Review of Systems: A complete review of systems (ROS) was obtained from the patient. I have reviewed this information and discussed as appropriate with the patient. See HPI as well for other pertinent ROS.  Constitutional: No fevers, chills, sweats. Weight stable Eyes: No vision changes, No discharge HENT: No sore throats, nasal drainage Lymph: No neck swelling, No bruising easily Pulmonary: No cough, productive sputum CV: No orthopnea, PND . No exertional chest/neck/shoulder/arm pain. Patient can walk 2-3 miles without difficulty.   GI: No personal nor family history of GI/colon cancer, inflammatory bowel disease, irritable bowel syndrome, allergy such as Celiac Sprue, dietary/dairy problems, colitis, ulcers nor gastritis. No recent sick contacts/gastroenteritis. No travel outside the country. No changes in diet.  Renal: No UTIs, No hematuria Genital: No drainage, bleeding, masses Musculoskeletal: No severe joint pain. Good ROM major joints Skin: No sores or lesions Heme/Lymph:  No easy bleeding. No swollen lymph nodes Neuro: No active seizures. No facial  droop Psych: No hallucinations. No agitation  OBJECTIVE   Vitals:  08/12/22 1117  BP: 136/80  Pulse: 99  Temp: 36.7 C (98 F)  SpO2: 98%  Weight: 87.1 kg (192 lb)  Height: 170.2 cm ('5\' 7"'$ )   Body mass index is 30.07 kg/m.  PHYSICAL EXAM:  Constitutional: Not cachectic. Hygeine adequate. Vitals signs as above.  Eyes: Wears glasses - vision corrected,Pupils reactive, normal extraocular movements. Sclera nonicteric Neuro: CN II-XII intact. No major focal sensory defects. No major motor deficits. Lymph: No head/neck/groin lymphadenopathy Psych: No severe agitation. No severe anxiety. Judgment & insight Adequate, Oriented x4, HENT: Normocephalic, Mucus membranes moist. No thrush. Hearing: adequate Neck: Supple, No tracheal deviation. No obvious thyromegaly Chest: No pain to chest wall compression. Good respiratory excursion. No audible wheezing CV: Pulses intact. regular. No major extremity edema Ext: No obvious deformity or contracture. Edema: Not present. No cyanosis Skin: No major subcutaneous nodules. Warm and dry Musculoskeletal: Severe joint rigidity not present. No obvious clubbing. No digital petechiae. Mobility: no assist device moving easily without restrictions  Abdomen: Flat Soft. Nondistended. Nontender. Hernia: Not present. Diastasis recti: Mild supraumbilical midline. No hepatomegaly. No splenomegaly.  Genital/Pelvic: Inguinal hernia: Not present. Inguinal lymph nodes: without lymphadenopathy nor hidradenitis.   Rectal:   ##################################  Perianal skin Clean with good hygiene  Pruritis ani: Mild Pilonidal disease: Not present Condyloma / warts: Not present  Anal fissure: Not present Perirectal abscess/fistula Not present External hemorrhoids Not present  Digital and anoscopic rectal exam Barely tolerated  Sphincter tone Normal  Hemorrhoidal piles enlarged internal hemorrhoids right posterior and left lateral. Can prolapse out. Some  reductive rectum but no true circumferential prolapse. Prostate: N/A Rectovaginal septum: N/A Rectal masses: Not present  Other significant findings: N/A  Patient examined with patient in decubitus position .  ###################################    ###################################################################  Labs, Imaging and Diagnostic Testing:  Located in 'Care Everywhere' section of Epic EMR chart  PRIOR [facility] CLINIC NOTES:  Not applicable  SURGERY NOTES:  Not applicable  PATHOLOGY:  Not applicable  Assessment and Plan:  DIAGNOSES:  Diagnoses and all orders for this visit:  Internal hemorrhoid, bleeding  Prolapsed internal hemorrhoids, grade 3  History of colon polyps    ASSESSMENT/PLAN  Pleasant patient with many years of hemorrhoids now with increasing episodes of bleeding and prolapse despite pretty decent bowel regimen/function.  I think he is exhausted nonoperative intervention. These are to large and irritated to tolerate banding. I am skeptical work well once he has significant bleeding and regular prolapse. Therefore recommended outpatient surgery. Hemorrhoidal ligation/pexy with probable hemorrhoidectomy of the prolapsing piles.  The anatomy & physiology of the anorectal region was discussed. The pathophysiology of hemorrhoids and differential diagnosis was discussed. Natural history risks without surgery was discussed. I stressed the importance of a bowel regimen to have daily soft bowel movements to minimize progression of disease. Interventions such as sclerotherapy & banding were discussed.  The patient's symptoms are not adequately controlled by medicines and other non-operative treatments. I feel the risks & problems of no surgery outweigh the operative risks; therefore, I recommended surgery to treat the hemorrhoids by ligation, pexy, and possible resection.  Risks such as bleeding, infection, urinary difficulties, injury to other organs,  need for repair of tissues / organs, need for further treatment, heart attack, death, and other risks were discussed. I noted a good likelihood this will help address the problem.  Goals of post-operative recovery were discussed as well. Possibility that this will not correct all symptoms was explained. Post-operative pain, bleeding, constipation, and other problems after surgery were discussed. We will work to minimize complications. Educational handouts further explaining the pathology, treatment options, and bowel regimen were given as well. Questions were answered. The patient expresses understanding & wishes to proceed with surgery.  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 I cautioned he would need at least 4-6 weeks before completing completely unrestricted with this. Does not sound like he definitely needs FMLA. Usually I see people around 3 weeks and see how things are going and see if he can go back faster or needs more time.   Adin Hector, MD, FACS, MASCRS Esophageal, Gastrointestinal & Colorectal Surgery Robotic and Minimally Invasive Surgery  [facility] [address] [place], [place] [address] [phone] Fax [phone] Main  CONTACT INFORMATION:  Weekday (9AM-5PM): Call [facility] main office at [phone]  Weeknight (5PM-9AM) or Weekend/Holiday: Check [email or web address] (password " [email or web address]") for General Surgery [facility] coverage  (Please, do not use SecureChat as it is not reliable communication to reach operating surgeons for immediate patient care given surgeries/outpatient duties/clinic/cross-coverage/off post-call which would lead to a delay in care.  Epic staff messaging available for outptient concerns, but may not be answered for 48 hours or more).     09/20/2022

## 2022-09-20 NOTE — Anesthesia Postprocedure Evaluation (Signed)
Anesthesia Post Note  Patient: Jerry Roman  Procedure(s) Performed: ANORECTAL EXAM UNDER ANESTHESIA WITH HEMORRHOIDECTOMY WITH LIGATION AND HEMORRHOIDOPEXY     Patient location during evaluation: PACU Anesthesia Type: General Level of consciousness: awake and alert Pain management: pain level controlled Vital Signs Assessment: post-procedure vital signs reviewed and stable Respiratory status: spontaneous breathing, nonlabored ventilation, respiratory function stable and patient connected to nasal cannula oxygen Cardiovascular status: blood pressure returned to baseline and stable Postop Assessment: no apparent nausea or vomiting Anesthetic complications: no  No notable events documented.  Last Vitals:  Vitals:   09/20/22 1145 09/20/22 1200  BP: (!) 163/90 (!) 163/90  Pulse: 99 97  Resp:    Temp:    SpO2: 97% 97%    Last Pain:  Vitals:   09/20/22 1030  TempSrc:   PainSc: 0-No pain                 Zaniyah Wernette

## 2022-09-20 NOTE — Interval H&P Note (Signed)
History and Physical Interval Note:  09/20/2022 7:07 AM  Jerry Roman  has presented today for surgery, with the diagnosis of HEMORRHOIDS.  The various methods of treatment have been discussed with the patient and family. After consideration of risks, benefits and other options for treatment, the patient has consented to  Procedure(s): ANORECTAL EXAM UNDER ANESTHESIA WITH HEMORRHOIDECTOMY WITH LIGATION AND HEMORRHOIDOPEXY (N/A) as a surgical intervention.  The patient's history has been reviewed, patient examined, no change in status, stable for surgery.  I have reviewed the patient's chart and labs.  Questions were answered to the patient's satisfaction.    I have re-reviewed the the patient's records, history, medications, and allergies.  I have re-examined the patient.  I again discussed intraoperative plans and goals of post-operative recovery.  The patient agrees to proceed.  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   Thrombocytopenia ([place]) 06/26/2015    Past Medical History:  Diagnosis Date   Acid reflux    Anemia    Arthritis    Basal cell carcinoma (BCC) of back    Hemorrhoids    History of kidney stones    Hypertension    Polycythemia, 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   RBBB    Testosterone deficiency     Past Surgical History:  Procedure Laterality Date   EYE SURGERY     HAND SURGERY     SKIN CANCER EXCISION     TONSILLECTOMY     VASECTOMY      Social History   Socioeconomic History   Marital status: Married    Spouse name: Not on file   Number of children:  Not on file   Years of education: Not on file   Highest education level: Not on file  Occupational History   Not on file  Tobacco Use   Smoking status: Former    Types: Cigarettes    Quit date: 12/27/1994    Years since quitting: 27.7   Smokeless tobacco: Not on file  Vaping Use   Vaping Use: Never used  Substance and Sexual Activity   Alcohol use: Yes    Comment: 6-7 beers a month   Drug use: No   Sexual activity: Not on file  Other Topics Concern   Not on file  Social History Narrative   Not on file   Social Determinants of Health   Financial Resource Strain: Not on file  Food Insecurity: Not on file  Transportation Needs: Not on file  Physical Activity: Not on file  Stress: Not on file  Social Connections: Not on file  Intimate Partner Violence: Not on file    History reviewed. No pertinent family history.  Medications Prior to Admission  Medication Sig Dispense Refill Last Dose   acetaminophen (TYLENOL) 500 MG tablet Take 1,000 mg by mouth every 6 (six) hours as needed (pain.).   Past Month   Boswellia-Glucosamine-Vit D (OSTEO BI-FLEX ONE PER DAY PO) Take 1 tablet by mouth in the morning.   09/19/2022   cetirizine (ZYRTEC) 10 MG tablet Take 10 mg by mouth in the morning.   Past Week   Cholecalciferol (VITAMIN D3) 50 MCG (2000 UT) TABS Take  2,000 Units by mouth daily.   09/19/2022   cyanocobalamin (VITAMIN B12) 1000 MCG tablet Take 1,000 mcg by mouth 2 (two) times a week. Mondays and Fridays   Past Week   ibuprofen (ADVIL) 200 MG tablet Take 400 mg by mouth every 8 (eight) hours as needed (pain.).   Past Month   Iron-FA-B Cmp-C-Biot-Probiotic (FUSION PLUS) CAPS Take 1 capsule by mouth every Monday. In the morning.   Past Week   losartan (COZAAR) 100 MG tablet Take 100 mg by mouth in the morning.   09/19/2022   Omega-3 Fatty Acids (OMEGA 3 PO) Take 2,000 mg by mouth in the morning.   09/19/2022   pantoprazole (PROTONIX) 20 MG tablet Take 20 mg by mouth daily before  breakfast.   09/19/2022   rosuvastatin (CRESTOR) 10 MG tablet Take 10 mg by mouth in the morning.   09/19/2022   tadalafil (CIALIS) 10 MG tablet Take 10 mg by mouth daily as needed for erectile dysfunction. troches   Past Month   testosterone cypionate (DEPOTESTOSTERONE CYPIONATE) 200 MG/ML injection Inject 200 mg into the muscle every 14 (fourteen) days. Thursdays in the evening.   09/19/2022   TURMERIC CURCUMIN PO Take 1,000 mg by mouth in the morning. Qunol Turmeric Curcumin   09/19/2022    Current Facility-Administered Medications  Medication Dose Route Frequency Provider Last Rate Last Admin   bupivacaine liposome (EXPAREL) 1.3 % injection 266 mg  20 mL Infiltration Once Michael Boston, MD       cefTRIAXone (ROCEPHIN) 2 g in sodium chloride 0.9 % 100 mL IVPB  2 g Intravenous On Call to OR Michael Boston, MD       Chlorhexidine Gluconate Cloth 2 % PADS 6 each  6 each Topical Once Michael Boston, MD       And   Chlorhexidine Gluconate Cloth 2 % PADS 6 each  6 each Topical Once Michael Boston, MD       Derrill Memo ON 09/21/2022] feeding supplement (ENSURE PRE-SURGERY) liquid 296 mL  296 mL Oral Once Michael Boston, MD       lactated ringers infusion   Intravenous Continuous Santa Lighter, MD 10 mL/hr at 09/20/22 0618 Continued from Pre-op at 09/20/22 0618     Allergies  Allergen Reactions   Augmentin [Amoxicillin-Pot Clavulanate] Other (See Comments)    Gi distress    BP (!) 147/89   Pulse 100   Temp 98.3 F (36.8 C) (Oral)   Resp 16   SpO2 97%   Labs: No results found for this or any previous visit (from the past 48 hour(s)).  Imaging / Studies: No results found.   Adin Hector, M.D., F.A.C.S. Gastrointestinal and Minimally Invasive Surgery [facility] [address] [place], [place] [address] [phone] Main / Paging  09/20/2022 7:07 AM    Adin Hector

## 2022-09-20 NOTE — Discharge Instructions (Signed)
##############################################  ANORECTAL SURGERY:  POST OPERATIVE INSTRUCTIONS  ######################################################################  EAT Start with a pureed / full liquid diet After 24 hours, gradually transition to a high fiber diet.    CONTROL PAIN Control pain so you can tolerate bowel movements,  walk, sleep, tolerate sneezing/coughing, and go up/down stairs.   HAVE A BOWEL MOVEMENT DAILY Keep your bowels regular to avoid problems.   Taking a fiber supplement every day to keep bowels soft.   Try a laxative to override constipation. Use an antidairrheal to slow down diarrhea.   Call if not better after 2 tries  WALK Walk an hour a day.  Control your pain to do that.   CALL IF YOU HAVE PROBLEMS/CONCERNS Call if you are still struggling despite following these instructions. Call if you have concerns not answered by these instructions  ######################################################################    Take your usually prescribed home medications unless otherwise directed.  DIET: Follow a light bland diet & liquids the first 24 hours after arrival home, such as soup, liquids, starches, etc.  Be sure to drink plenty of fluids.  Quickly advance to a usual solid diet within a few days.  Avoid fast food or heavy meals as your are more likely to get nauseated or have irregular bowels.  A low-fat, high-fiber diet for the rest of your life is ideal.  PAIN CONTROL: Expect swelling and discomfort in the anus/rectal area. Pain is best controlled by a usual combination of many methods TOGETHER: Warm baths/soaks or Ice packs Over the counter pain medication Prescription pain medications Topical creams    Warm water baths or ice packs (30-60 minutes up to 8 times a day, especially after bowel meovements) will help. Use ice for the first few days to help decrease swelling and bruising, then switch to heat such as warm towels, sitz baths, warm  baths, warm showers, etc to help relax tight/sore spots and speed recovery.  Some people prefer to use ice alone, heat alone, alternating between ice & heat.  Experiment to what works for you.    It is helpful to take an over-the-counter pain medication continuously for the first few weeks.  Choose one of the following that works best for you: Naproxen (Aleve, etc)  Two 252m tabs twice a day Ibuprofen (Advil, etc) Three 2070mtabs four times a day (every meal & bedtime) Acetaminophen (Tylenol, etc) 500-65044mour times a day (every meal & bedtime)  A  prescription for pain medication (such as oxycodone, hydrocodone, etc) should be given to you upon discharge.  Take your pain medication as prescribed.  If you are having problems/concerns with the prescription medicine (does not control pain, nausea, vomiting, rash, itching, etc), please call us [phone] see if we need to switch you to a different pain medicine that will work better for you and/or control your side effect better. If you need a refill on your pain medication, please contact your pharmacy.  They will contact our office to request authorization. Prescriptions will not be filled after 5 pm or on week-ends.  If can take up to 48 hours for it to be filled & ready so avoid waiting until you are down to thel ast pill.  A topical cream (Dibucaine) or a prescription for a cream (such as diltiazem 2% gel) may be given to you.  Many people find relief with topical creams.  Some people find it burns too much.  Experiment.  If it helps, use it.  If it burns, don't  using it.  You also may receive a prescription for diazepam, a muscle relaxant to help you to be able to urinate and defecate more easily.  It is safe to take a few doses with the other medications as long as you are not planning to drive or do anything intense.  Hopefully this can minimize the chance of needing a Foley catheter into your bladder     KEEP YOUR BOWELS  REGULAR The goal is one soft bowel movement a day Avoid getting constipated.  Between the surgery and the pain medications, it is common to experience some constipation.  Increasing fluid intake and taking a fiber supplement (such as Metamucil, Citrucel, FiberCon, MiraLax, etc) 2-4 times a day regularly will usually help prevent this problem from occurring.  A mild laxative (prune juice, Milk of Magnesia, MiraLax, etc) should be taken according to package directions if there are no bowel movements after 48 hours. Watch out for diarrhea.  If you have many loose bowel movements, simplify your diet to bland foods & liquids for a few days.  Stop any stool softeners and decrease your fiber supplement.  Switching to mild anti-diarrheal medications (Kayopectate, Pepto Bismol) can help.  Can try an imodium/loperamide dose.  If this worsens or does not improve, please call us.  Wound Care   a. You have some fluffed gauze on top of the anus to help catch drainage and bleeding.  THERE IS NO PACKING INSIDE THE RECTUM -Let the gauze fall off with the first bowel movement or shower.  It is okay to reinforce or replace as needed.  Bleeding is common at first and occasionally tapers off   b. Place soft cotton balls on the anus/wounds and use an absorbent pad in your underwear as needed to catch any drainage and help keep the area.  Try to use cotton balls or pads over regular gauze or toilet paper as gauze will stick and pull, causing pain.  Cotton will come off more easily.   c. Keep the area clean and dry.  Bathe / shower every day.  Keep the area clean by showering / bathing over the incision / wound.   It is okay to soak an open wound to help wash it.  Consider using a squeeze bottle filled with warm water to gently wash the anal area.  Wet wipes or showers / gentle washing after bowel movements is often less traumatic than regular toilet paper.  Use a Sitz Bath 4-8 times a day for relief  A sitz bath is a warm  water bath taken in the sitting position that covers only the hips and buttocks. It may be used for either healing or hygiene purposes. Sitz baths are also used to relieve pain, itching, or muscle spasms.  Gently cleaned the area and the heat will help lower spasm and offer better pain control.    Fill the bathtub half full with warm water. Sit in the water and open the drain a little. Turn on the warm water to keep the tub half full. Keep the water running constantly. Soak in the water for 15 to 20 minutes. After the sitz bath, pat the affected area dry first.   d. You will often notice bleeding, especially with bowel movements.  This should slow down by the end of the first week of surgery.  Sitting on an ice pack can help.   e. Expect some drainage.  You often will have some blood or yellow drainage with open wounds.  Sometimes she will get a little leaking of liquid stool until the incision/wounds have fully close down.  This should slow down by the end of the first week of surgery, but you will have occasional bleeding or drainage up to a few months after surgery.  Wear an absorbent pad or soft cotton gauze in your underwear until the drainage stops.  ACTIVITIES as tolerated:    You may resume regular (light) daily activities beginning the next day--such as daily self-care, walking, climbing stairs--gradually increasing activities as tolerated.  If you can walk 30 minutes without difficulty, it is safe to try more intense activity such as jogging, treadmill, bicycling, low-impact aerobics, swimming, etc. Save the most intensive and strenuous activity for last such as sit-ups, heavy lifting, contact sports, etc  Refrain from any heavy lifting or straining until you are off narcotics for pain control.   DO NOT PUSH THROUGH PAIN.  Let pain be your guide: If it hurts to do something, don't do it.  Pain is your body warning you to avoid that activity for another week until the pain goes down. You  may drive when you are no longer taking prescription pain medication, you can comfortably sit for long periods of time, and you can safely maneuver your car and apply brakes. You may have sexual intercourse when it is comfortable.   FOLLOW UP in our office Please call [facility] at [phone] to set up an appointment to see your surgeon in the office for a follow-up appointment approximately 3 weeks after your surgery. Make sure that you call for this appointment the day you arrive home to ensure a convenient appointment time.  8. IF YOU HAVE DISABILITY OR FAMILY LEAVE FORMS, BRING THEM TO THE OFFICE FOR PROCESSING.  DO NOT GIVE THEM TO YOUR DOCTOR.        WHEN TO CALL us [phone]: Poor pain control Reactions / problems with new medications (rash/itching, nausea, etc)  Fever over 101.5 F (38.5 C) Inability to urinate Nausea and/or vomiting Worsening swelling or bruising Continued bleeding from incision. Increased pain, redness, or drainage from the incision  The clinic staff is available to answer your questions during regular business hours (8:30am-5pm).  Please don't hesitate to call and ask to speak to one of our nurses for clinical concerns.   A surgeon from [facility] is always on call at the hospitals   If you have a medical emergency, go to the nearest emergency room or call 911.    [facility], [place], [place], [place], [place]  [address] ? MAIN: [phone] ? TOLL FREE: [phone] ? FAX [phone] [email or web address]  #####################################################  HEMORRHOIDS   Hemorrhoidal piles are natural clusters of blood vessels that help the rectum and anal canal stretch to hold stool and allow bowel movements.  Most people will develop a flare of hemorrhoids in their lifetime.  When hemorrhoidals are irritated, they can swell, burn, itch, cause pain, and bleed.  Most flares will calm  down gradually within a few weeks.  However, once hemorrhoids are created, they tend to flare more easily.  Fortunately, good habits and simple medical treatment usually control hemorrhoids well, and surgery is needed only in severe cases.  TREATMENT OF HEMORRHOID FLARE Warm soaks. 4-8 times a day This helps more than any topical medication.   A sitz bath is a warm water bath taken in the sitting position that covers only the hips and buttocks.Fill the bathtub  half full with warm water. Soak in the water for 15 to 30 minutes. After the sitz bath, pat the affected area dry first.  Normalize your bowels.  Extremes of diarrhea or constipation will make hemorrhoids worse.  One soft bowel movement a day is the goal.   Wet wipes instead of toilet paper Pain control with a NSAID such as ibuprofen (Advil) or naproxen (Aleve) or acetaminophen (Tylenol) around the clock.  Narcotics are constipating and should be minimized if possible Topical creams contain steroids (bydrocortisone) or local anesthetic (xylocaine) can help make pain and itching more tolerable.    TROUBLESHOOTING IRREGULAR BOWELS 1) Avoid extremes of bowel movements (no bad constipation/diarrhea) 2) Miralax 17gm in 8oz. water or juice every day. May use twice a day.  3) Gas-x or Phazyme as needed for gas & bloating.  4) Soft & bland diet. No spicy, greasy, or fried foods.  5) Omeprazole over-the-counter as needed  6) May hold gluten/wheat products from diet to see if symptoms improve.  7)  May try probiotics (Align, Activa, etc) to help calm the bowels down 7) If symptoms become worse: Call back immediately.

## 2022-09-21 ENCOUNTER — Emergency Department (HOSPITAL_BASED_OUTPATIENT_CLINIC_OR_DEPARTMENT_OTHER)
Admission: EM | Admit: 2022-09-21 | Discharge: 2022-09-21 | Disposition: A | Discharge status: Home / Self Care | Payer: No Typology Code available for payment source | Attending: Emergency Medicine | Admitting: Emergency Medicine

## 2022-09-21 ENCOUNTER — Other Ambulatory Visit: Payer: Self-pay

## 2022-09-21 ENCOUNTER — Encounter (HOSPITAL_COMMUNITY): Payer: Self-pay | Admitting: Surgery

## 2022-09-21 DIAGNOSIS — R338 Other retention of urine: Secondary | ICD-10-CM

## 2022-09-21 DIAGNOSIS — R339 Retention of urine, unspecified: Secondary | ICD-10-CM | POA: Insufficient documentation

## 2022-09-21 LAB — URINALYSIS, ROUTINE W REFLEX MICROSCOPIC
Bilirubin Urine: NEGATIVE
Glucose, UA: NEGATIVE mg/dL
Hgb urine dipstick: NEGATIVE
Ketones, ur: NEGATIVE mg/dL
Leukocytes,Ua: NEGATIVE
Nitrite: NEGATIVE
Protein, ur: NEGATIVE mg/dL
Specific Gravity, Urine: 1.005 (ref 1.005–1.030)
pH: 6.5 (ref 5.0–8.0)

## 2022-09-21 NOTE — ED Notes (Signed)
Urinary Cath 16Fr placed following strict sterile technique. Pt tolerated well with 924m of colorless urine out. UA collected.

## 2022-09-21 NOTE — Discharge Instructions (Signed)
1.  Return if you have fever, increasing abdominal pain, your catheter is not draining or other concerning changes. 2.  Continue to work with your surgeon regarding post operative rectal pain.  Return to the emergency department if needed.

## 2022-09-21 NOTE — ED Provider Notes (Signed)
[facility] EMERGENCY DEPT Provider Note   CSN: [id number] Arrival date & time: 09/21/22  1911     History  Chief Complaint  Patient presents with   Urinary Retention    Jerry Roman is a 56 y.o. male.  HPI Patient had hemorrhoid surgery yesterday.  Postoperatively he had some difficulty with urinary retention but managed to pass urine.  He reports since this morning he is not been able to pass any urine for the last 5 to 6 hours.  He has had increasing pressure in the lower abdomen.  He contacted his surgeon and was advised to come the emergency department for urinary retention.    Home Medications Prior to Admission medications   Medication Sig Start Date End Date Taking? Authorizing Provider  acetaminophen (TYLENOL) 500 MG tablet Take 1,000 mg by mouth every 6 (six) hours as needed (pain.).    [provider]  Boswellia-Glucosamine-Vit D (OSTEO BI-FLEX ONE PER DAY PO) Take 1 tablet by mouth in the morning.    [provider]  cetirizine (ZYRTEC) 10 MG tablet Take 10 mg by mouth in the morning.    [provider]  Cholecalciferol (VITAMIN D3) 50 MCG (2000 UT) TABS Take 2,000 Units by mouth daily.    [provider]  cyanocobalamin (VITAMIN B12) 1000 MCG tablet Take 1,000 mcg by mouth 2 (two) times a week. Mondays and Fridays    [provider]  diazepam (VALIUM) 5 MG tablet Take 1 tablet (5 mg total) by mouth every 8 (eight) hours as needed for muscle spasms (difficulty urinating). 09/20/22   Michael Boston, MD  ibuprofen (ADVIL) 200 MG tablet Take 400 mg by mouth every 8 (eight) hours as needed (pain.).    [provider]  Iron-FA-B Cmp-C-Biot-Probiotic (FUSION PLUS) CAPS Take 1 capsule by mouth every Monday. In the morning.    [provider]  losartan (COZAAR) 100 MG tablet Take 100 mg by mouth in the morning.    [provider]  Omega-3 Fatty Acids (OMEGA 3 PO) Take 2,000 mg by mouth in  the morning.    [provider]  oxyCODONE (OXY IR/ROXICODONE) 5 MG immediate release tablet Take 1 tablet (5 mg total) by mouth every 6 (six) hours as needed for moderate pain, severe pain or breakthrough pain. 09/20/22   Michael Boston, MD  pantoprazole (PROTONIX) 20 MG tablet Take 20 mg by mouth daily before breakfast.    [provider]  rosuvastatin (CRESTOR) 10 MG tablet Take 10 mg by mouth in the morning.    [provider]  tadalafil (CIALIS) 10 MG tablet Take 10 mg by mouth daily as needed for erectile dysfunction. troches    [provider]  testosterone cypionate (DEPOTESTOSTERONE CYPIONATE) 200 MG/ML injection Inject 200 mg into the muscle every 14 (fourteen) days. Thursdays in the evening.    [provider]  TURMERIC CURCUMIN PO Take 1,000 mg by mouth in the morning. Qunol Turmeric Curcumin    [provider]      Allergies    Augmentin [amoxicillin-pot clavulanate]    Review of Systems   Review of Systems  Physical Exam Updated Vital Signs BP (!) 141/84 (BP Location: Right Arm)   Pulse 89   Temp 98.3 F (36.8 C) (Oral)   Resp 18   Ht '5\' 7"'$  (1.702 m)   Wt 86 kg   SpO2 99%   BMI 29.69 kg/m  Physical Exam Constitutional:      Comments: Alert  nontoxic no respiratory distress.  HENT:     Mouth/Throat:     Pharynx: Oropharynx is clear.  Eyes:     Extraocular Movements: Extraocular movements intact.  Pulmonary:     Effort: Pulmonary effort is normal.  Abdominal:     General: There is no distension.     Palpations: Abdomen is soft.     Tenderness: There is no abdominal tenderness. There is no guarding.  Musculoskeletal:        General: Normal range of motion.  Skin:    General: Skin is warm and dry.  Neurological:     General: No focal deficit present.     Mental Status: He is oriented to person, place, and time.     ED Results / Procedures / Treatments   Labs (all labs ordered are listed, but only  abnormal results are displayed) Labs Reviewed  URINALYSIS, ROUTINE W REFLEX MICROSCOPIC - Abnormal; Notable for the following components:      Result Value   Color, Urine COLORLESS (*)    All other components within normal limits    EKG None  Radiology No results found.  Procedures Procedures    Medications Ordered in ED Medications - No data to display  ED Course/ Medical Decision Making/ A&P                           Medical Decision Making Amount and/or Complexity of Data Reviewed Labs: ordered.   Patient presents with urinary retention.  Foley catheter placed with 900 cc out.  Patient reports much improved.  Presentation consistent with postoperative urinary retention.  No fevers or chills.  Patient also has rectal pain from hemorrhoid surgery.  He reports he has medications to take at home for pain.  No immediate concerns regarding postoperative pain control.  He advises he is in direct contact with the surgeon as needed.  Patient is discharged in stable condition.  Return precautions reviewed.        Final Clinical Impression(s) / ED Diagnoses Final diagnoses:  Acute urinary retention    Rx / DC Orders ED Discharge Orders     None         Charlesetta Shanks, MD 09/21/22 2205

## 2022-09-21 NOTE — ED Triage Notes (Signed)
POV from home, pt c/o no urination x 5-6 hrs.  Surgery on hemorrhoids yesterday. Difficulty urinating since then. NAD. A&O x 4. Amb to triage.   84m+ on bladder scan.

## 2022-09-24 LAB — SURGICAL PATHOLOGY

## 2024-04-07 ENCOUNTER — Other Ambulatory Visit: Payer: Self-pay

## 2024-04-07 ENCOUNTER — Emergency Department (HOSPITAL_BASED_OUTPATIENT_CLINIC_OR_DEPARTMENT_OTHER)
Admission: EM | Admit: 2024-04-07 | Discharge: 2024-04-07 | Disposition: A | Discharge status: Home / Self Care | Attending: Emergency Medicine | Admitting: Emergency Medicine

## 2024-04-07 ENCOUNTER — Emergency Department (HOSPITAL_BASED_OUTPATIENT_CLINIC_OR_DEPARTMENT_OTHER): Admitting: Radiology

## 2024-04-07 ENCOUNTER — Encounter (HOSPITAL_BASED_OUTPATIENT_CLINIC_OR_DEPARTMENT_OTHER): Payer: Self-pay | Admitting: Emergency Medicine

## 2024-04-07 DIAGNOSIS — Z79899 Other long term (current) drug therapy: Secondary | ICD-10-CM | POA: Insufficient documentation

## 2024-04-07 DIAGNOSIS — S61012A Laceration without foreign body of left thumb without damage to nail, initial encounter: Secondary | ICD-10-CM | POA: Insufficient documentation

## 2024-04-07 DIAGNOSIS — W208XXA Other cause of strike by thrown, projected or falling object, initial encounter: Secondary | ICD-10-CM | POA: Diagnosis not present

## 2024-04-07 DIAGNOSIS — S6992XA Unspecified injury of left wrist, hand and finger(s), initial encounter: Secondary | ICD-10-CM | POA: Diagnosis present

## 2024-04-07 DIAGNOSIS — I10 Essential (primary) hypertension: Secondary | ICD-10-CM | POA: Diagnosis not present

## 2024-04-07 DIAGNOSIS — Y92007 Garden or yard of unspecified non-institutional (private) residence as the place of occurrence of the external cause: Secondary | ICD-10-CM | POA: Insufficient documentation

## 2024-04-07 MED ORDER — HYDROCODONE-ACETAMINOPHEN 5-325 MG PO TABS: 1.0000 | ORAL_TABLET | Freq: Four times a day (QID) | ORAL | 0 refills | Status: AC | PRN

## 2024-04-07 MED ORDER — BUPIVACAINE HCL (PF) 0.5 % IJ SOLN
10.0000 mL | Freq: Once | INTRAMUSCULAR | Status: AC
  Administered 2024-04-07: 10 mL
  Filled 2024-04-07: qty 10

## 2024-04-07 NOTE — ED Provider Notes (Signed)
 [place] [facility] AT [facility] Provider Note   CSN: [id number] Arrival date & time: 04/07/24  1525     Patient presents with: Laceration   Jerry Roman is a 58 y.o. male.   Patient with history of hypertension presents today with complaints of left thumb injury. He reports same occurred when he was mowing the yard and a iron bell fell and hit his left thumb.  Endorses pain to same.  Has 2 lacerations on his thumb.  He is not anticoagulated.  Denies any other injuries or complaints.  His last Tdap was October 2020.  The history is provided by the patient. No language interpreter was used.  Laceration      Prior to Admission medications   Medication Sig Start Date End Date Taking? Authorizing Provider  acetaminophen  (TYLENOL ) 500 MG tablet Take 1,000 mg by mouth every 6 (six) hours as needed (pain.).    [provider]  Boswellia-Glucosamine-Vit D (OSTEO BI-FLEX ONE PER DAY PO) Take 1 tablet by mouth in the morning.    [provider]  cetirizine (ZYRTEC) 10 MG tablet Take 10 mg by mouth in the morning.    [provider]  Cholecalciferol (VITAMIN D3) 50 MCG (2000 UT) TABS Take 2,000 Units by mouth daily.    [provider]  cyanocobalamin  (VITAMIN B12) 1000 MCG tablet Take 1,000 mcg by mouth 2 (two) times a week. Mondays and Fridays    [provider]  diazepam  (VALIUM ) 5 MG tablet Take 1 tablet (5 mg total) by mouth every 8 (eight) hours as needed for muscle spasms (difficulty urinating). 09/20/22   Sheldon Standing, MD  ibuprofen (ADVIL) 200 MG tablet Take 400 mg by mouth every 8 (eight) hours as needed (pain.).    [provider]  Iron-FA-B Cmp-C-Biot-Probiotic (FUSION PLUS) CAPS Take 1 capsule by mouth every Monday. In the morning.    [provider]  losartan (COZAAR) 100 MG tablet Take 100 mg by mouth in the morning.    [provider]  Omega-3 Fatty Acids (OMEGA 3 PO) Take 2,000 mg  by mouth in the morning.    [provider]  oxyCODONE  (OXY IR/ROXICODONE ) 5 MG immediate release tablet Take 1 tablet (5 mg total) by mouth every 6 (six) hours as needed for moderate pain, severe pain or breakthrough pain. 09/20/22   Sheldon Standing, MD  pantoprazole (PROTONIX) 20 MG tablet Take 20 mg by mouth daily before breakfast.    [provider]  rosuvastatin (CRESTOR) 10 MG tablet Take 10 mg by mouth in the morning.    [provider]  tadalafil (CIALIS) 10 MG tablet Take 10 mg by mouth daily as needed for erectile dysfunction. troches    [provider]  testosterone  cypionate (DEPOTESTOSTERONE CYPIONATE) 200 MG/ML injection Inject 200 mg into the muscle every 14 (fourteen) days. Thursdays in the evening.    [provider]  TURMERIC CURCUMIN PO Take 1,000 mg by mouth in the morning. Qunol Turmeric Curcumin    [provider]    Allergies: Augmentin [amoxicillin-pot clavulanate]    Review of Systems  Skin:  Positive for wound.  All other systems reviewed and are negative.   Updated Vital Signs BP (!) 156/94 (BP Location: Right Arm)   Pulse 100   Temp 98.9 F (37.2 C)   Resp 17   Ht 5' 7 (1.702 m)   Wt 90.3 kg   SpO2 99%   BMI 31.17 kg/m   Physical  Exam Vitals and nursing note reviewed.  Constitutional:      General: He is not in acute distress.    Appearance: Normal appearance. He is normal weight. He is not ill-appearing, toxic-appearing or diaphoretic.  HENT:     Head: Normocephalic and atraumatic.  Cardiovascular:     Rate and Rhythm: Normal rate.  Pulmonary:     Effort: Pulmonary effort is normal. No respiratory distress.  Musculoskeletal:        General: Normal range of motion.     Cervical back: Normal range of motion.  Skin:    General: Skin is warm and dry.     Comments: 3 cm diagonal laceration noted to the dorsal aspect of the distal left thumb proximal to the nailbed. No nailbed involvement. Bleeding  controlled  4 cm linear gaping laceration noted to the palmar aspect of the left thumb. No obvious exposed tendons or underlying injury. Patient has some ability to flex the thumb but not completely.  Good capillary refill, good distal sensation. Does have swelling and bruising, no obvious deformity.   No other injuries or abnormalities to the hand or wrist.   Neurological:     General: No focal deficit present.     Mental Status: He is alert.  Psychiatric:        Mood and Affect: Mood normal.        Behavior: Behavior normal.     (all labs ordered are listed, but only abnormal results are displayed) Labs Reviewed - No data to display  EKG: None  Radiology: DG Finger Thumb Left Result Date: 04/07/2024 CLINICAL DATA:  Left thumb injury with lacerations. EXAM: LEFT THUMB 2+V COMPARISON:  None Available. FINDINGS: Soft tissue swelling without radiopaque foreign body, fracture or dislocation. IMPRESSION: Soft tissue swelling without radiopaque foreign body, fracture or dislocation. Electronically Signed   By: Newell Eke M.D.   On: 04/07/2024 16:46     .Laceration Repair  Date/Time: 04/07/2024 6:43 PM  Performed by: Nora Lauraine LABOR, PA-C Authorized by: Nora Lauraine LABOR, PA-C   Consent:    Consent obtained:  Verbal   Consent given by:  Patient   Risks, benefits, and alternatives were discussed: yes     Risks discussed:  Infection, pain, retained foreign body, tendon damage, vascular damage, poor wound healing, poor cosmetic result, need for additional repair and nerve damage   Alternatives discussed:  No treatment, delayed treatment, observation and referral Universal protocol:    Procedure explained and questions answered to patient or proxy's satisfaction: yes     Imaging studies available: yes     Patient identity confirmed:  Verbally with patient Anesthesia:    Anesthesia method:  Nerve block   Block location:  Digital block   Block needle gauge:  25 G   Block anesthetic:   Bupivacaine  0.5% w/o epi   Block technique:  Medial and lateral thumb   Block injection procedure:  Anatomic landmarks identified, introduced needle, incremental injection, anatomic landmarks palpated and negative aspiration for blood   Block outcome:  Anesthesia achieved Laceration details:    Location:  Finger   Length (cm):  3   Depth (mm):  1 Exploration:    Imaging obtained: x-ray     Imaging outcome: foreign body not noted     Wound exploration: wound explored through full range of motion and entire depth of wound visualized     Contaminated: no   Treatment:    Area cleansed with:  Soap and water  Amount of cleaning:  Standard   Irrigation solution:  Sterile saline   Irrigation volume:  500 ml   Irrigation method:  Pressure wash Skin repair:    Repair method:  Sutures   Suture size:  4-0   Suture material:  Prolene   Suture technique:  Simple interrupted   Number of sutures:  5 Approximation:    Approximation:  Close Repair type:    Repair type:  Simple Post-procedure details:    Dressing:  Antibiotic ointment, non-adherent dressing and splint for protection   Procedure completion:  Tolerated well, no immediate complications .Laceration Repair  Date/Time: 04/07/2024 6:48 PM  Performed by: Nora Lauraine LABOR, PA-C Authorized by: Nora Lauraine LABOR, PA-C   Consent:    Consent obtained:  Verbal   Consent given by:  Patient   Risks, benefits, and alternatives were discussed: yes     Risks discussed:  Infection, need for additional repair, nerve damage, poor wound healing, poor cosmetic result, pain, retained foreign body, tendon damage and vascular damage   Alternatives discussed:  No treatment, delayed treatment, observation and referral Universal protocol:    Procedure explained and questions answered to patient or proxy's satisfaction: yes     Imaging studies available: yes     Patient identity confirmed:  Verbally with patient Anesthesia:    Anesthesia method:  Nerve  block   Block location:  Digital block   Block needle gauge:  25 G   Block anesthetic:  Bupivacaine  0.5% w/o epi   Block technique:  Medial and lateral left thumb base   Block injection procedure:  Anatomic landmarks identified, introduced needle, incremental injection, negative aspiration for blood and anatomic landmarks palpated   Block outcome:  Anesthesia achieved Laceration details:    Location:  Finger   Finger location:  L thumb   Length (cm):  4   Depth (mm):  3 Exploration:    Imaging obtained: x-ray     Imaging outcome: foreign body not noted     Wound exploration: wound explored through full range of motion and entire depth of wound visualized   Treatment:    Area cleansed with:  Soap and water and saline   Amount of cleaning:  Standard   Irrigation solution:  Sterile saline   Irrigation volume:  500 ml   Irrigation method:  Pressure wash Skin repair:    Repair method:  Sutures   Suture size:  4-0   Suture material:  Prolene   Suture technique:  Simple interrupted   Number of sutures:  7 Approximation:    Approximation:  Close Repair type:    Repair type:  Simple Post-procedure details:    Dressing:  Antibiotic ointment, non-adherent dressing and splint for protection   Procedure completion:  Tolerated well, no immediate complications    Medications Ordered in the ED  bupivacaine (PF) (MARCAINE ) 0.5 % injection 10 mL (has no administration in time range)                                    Medical Decision Making Amount and/or Complexity of Data Reviewed Radiology: ordered.  Risk Prescription drug management.   Patient presents today with complaints of left thumb lacerations.  He is afebrile, nontoxic-appearing, in no acute distress reassuring vital signs.  Physical exam reveals   3 cm diagonal laceration noted to the dorsal aspect of the distal left thumb proximal to the nailbed. No  nailbed involvement. Bleeding controlled  4 cm linear gaping  laceration noted to the palmar aspect of the left thumb. No obvious exposed tendons or underlying injury. Patient has some ability to flex the thumb but not completely.  Good capillary refill, good distal sensation. Does have swelling and bruising, no obvious deformity.   No other injuries or abnormalities to the hand or wrist.   X-ray imaging ordered and obtained which has resulted and reveals  Soft tissue swelling without radiopaque foreign body, fracture or dislocation.  I have personally reviewed and interpreted this imaging hide cardiology interpretation.  Pressure irrigation performed of both the patient's lacerations. Wound explored and base of wound visualized in a bloodless field without evidence of foreign body.  Lacerations occurred < 8 hours prior to repair which was well tolerated per above procedures.  Tdap up-to-date.  Pt has  no comorbidities to effect normal wound healing. Pt discharged  without antibiotics.  Given he did have some limitations to range of motion specifically flexion, and the laceration is under some tension given swelling, placed in a thumb spica splint for support.  Will also give referral to hand surgery for follow-up as needed given some concern for possible tendon injury however it is reassuring that he has some flexion capabilities and full extension.  Discussed suture home care with patient and answered questions. Pt to follow-up for wound check and suture removal in 7-10 days; they are to return to the ED sooner for signs of infection. Pt is hemodynamically stable with no complaints prior to dc. Evaluation and diagnostic testing in the emergency department does not suggest an emergent condition requiring admission or immediate intervention beyond what has been performed at this time.  Plan for discharge with close PCP follow-up.  Patient is understanding and amenable with plan, educated on red flag symptoms that would prompt immediate return.  Patient discharged in  stable condition.  Findings and plan of care discussed with supervising physician Dr. Emil who is in agreement.   Final diagnoses:  Thumb laceration, left, initial encounter    ED Discharge Orders     None     An After Visit Summary was printed and given to the patient.      Nora Lauraine LABOR, PA-C 04/07/24 DANIAL Emil Share, DO 04/07/24 2158

## 2024-04-07 NOTE — Discharge Instructions (Signed)
 You were seen in the emergency department for your left thumb laceration.  As we discussed, your x-ray was normal and did not show any fracture or dislocation.  We have closed your laceration(s) with sutures. These need to be removed in 7-10 days. This can be done at any doctor's office, urgent care, or emergency department.   If any of the sutures come out before it is time for removal, that is okay. Make sure to keep the area as clean and dry as possible. You can let warm soapy water run over the area, but do NOT scrub it.   Watch out for signs of infection, like we discussed, including: increased redness, tenderness, or drainage of pus from the area. If this happens and you have not been prescribed an antibiotic, please seek medical attention for possible infection.   You can take over the counter pain medicine like ibuprofen or tylenol  as needed.   Given that your stitches are under some tension and you do not currently have full range of motion, I have placed you in a splint to wear for support.  You need to wear this for the next few days.  If you continue to have difficulty with range of motion you will need to continue to wear the splint and call the hand specialist to follow-up with.  I have given you a referral with a number call to schedule an appointment for follow-up.  Return if development of any new or worsening symptoms.

## 2024-04-07 NOTE — ED Triage Notes (Signed)
 Pt via pov from home with lacerations on his left thumb after a heavy object fell on him while he was mowing grass. Has bleeding controlled. Pt a&o x 4; nad noted.

## 2024-04-09 ENCOUNTER — Encounter: Payer: Self-pay | Admitting: Advanced Practice Midwife
